# Patient Record
Sex: Female | Born: 1980 | Race: White | Hispanic: No | Marital: Single | Smoking: Never smoker
Health system: Southern US, Community
[De-identification: ages and names within clinical notes are randomized; demographics above are authoritative.]

## PROBLEM LIST (undated history)

## (undated) DIAGNOSIS — J45909 Unspecified asthma, uncomplicated: Secondary | ICD-10-CM

## (undated) DIAGNOSIS — J309 Allergic rhinitis, unspecified: Secondary | ICD-10-CM

## (undated) DIAGNOSIS — K219 Gastro-esophageal reflux disease without esophagitis: Secondary | ICD-10-CM

## (undated) DIAGNOSIS — R519 Headache, unspecified: Secondary | ICD-10-CM

## (undated) DIAGNOSIS — N926 Irregular menstruation, unspecified: Secondary | ICD-10-CM

## (undated) DIAGNOSIS — R51 Headache: Secondary | ICD-10-CM

## (undated) DIAGNOSIS — I10 Essential (primary) hypertension: Secondary | ICD-10-CM

## (undated) HISTORY — PX: OTHER SURGICAL HISTORY: SHX169

## (undated) HISTORY — DX: Essential (primary) hypertension: I10

## (undated) HISTORY — DX: Allergic rhinitis, unspecified: J30.9

## (undated) HISTORY — DX: Unspecified asthma, uncomplicated: J45.909

## (undated) HISTORY — PX: NASAL SINUS SURGERY: SHX719

---

## 2010-06-26 ENCOUNTER — Ambulatory Visit: Payer: Self-pay | Admitting: Internal Medicine

## 2012-03-12 ENCOUNTER — Ambulatory Visit: Payer: Self-pay | Admitting: Otolaryngology

## 2013-10-04 ENCOUNTER — Encounter: Payer: Self-pay | Admitting: Pulmonary Disease

## 2013-10-04 ENCOUNTER — Ambulatory Visit (INDEPENDENT_AMBULATORY_CARE_PROVIDER_SITE_OTHER): Payer: BC Managed Care – PPO | Admitting: Pulmonary Disease

## 2013-10-04 VITALS — BP 142/84 | HR 88 | Ht 63.0 in | Wt 143.0 lb

## 2013-10-04 DIAGNOSIS — J45909 Unspecified asthma, uncomplicated: Secondary | ICD-10-CM | POA: Insufficient documentation

## 2013-10-04 DIAGNOSIS — J309 Allergic rhinitis, unspecified: Secondary | ICD-10-CM

## 2013-10-04 DIAGNOSIS — K219 Gastro-esophageal reflux disease without esophagitis: Secondary | ICD-10-CM

## 2013-10-04 NOTE — Progress Notes (Signed)
Subjective:    Patient ID: Alexandria Sweeney, female    DOB: Mar 31, 1981, 32 y.o.   MRN: [id number]  HPI  This is a very pleasant 32 year old female who comes her clinic today for evaluation of asthma associated with allergies. She has had allergies over the years and recently was started on immunotherapy by [facility]. In the last year she has had some shortness of breath on exertion like when she is out for a walk. She had the sensation that she needed to take a deep breath to feel better. Sometimes cigarette smoke and stress will make her more short of breath and she feels that if she can step out of the situation her get away from the smoke and take a deep breath things get better. She was diagnosed with asthma by Dr. Meredeth Ide after apparently lung function tests showed "obstruction". She stated that her allergy symptoms have gotten better with immunotherapy. Her asthma has been treated with Pulmicort and albuterol. She says that the Pulmicort has really helped with the shortness of breath. She also takes Nasonex for postnasal drip as well and Singulair. Both of these are helping significantly. Acid reflux apparently contributes her shortness of breath. She typically props the head of her bed up. This week she said more nighttime symptoms because she recently changed her mattress and has failed to prop the new bed up. She never had significant respiratory symptoms as a child and never smoked cigarettes. She has minimal cough that she sometimes notes some wheezing. She denies chest tightness or pain. She uses her pro-air inhaler about 2-3 times a week.  Nothing has changed in her living environment recently. She lives in a house that has a basement but she does not believe there is any mold or mildew in the house.  Past Medical History  Diagnosis Date  . Hypertension   . Asthma   . Allergic rhinitis      Family History  Problem Relation Age of Onset  . Heart disease Mother    . Allergies Father   . Cancer Father   . Allergies Sister      History   Social History  . Marital Status: Single    Spouse Name: N/A    Number of Children: N/A  . Years of Education: N/A   Occupational History  . Not on file.   Social History Main Topics  . Smoking status: Never Smoker   . Smokeless tobacco: Never Used  . Alcohol Use: No  . Drug Use: No  . Sexual Activity: Not on file   Other Topics Concern  . Not on file   Social History Narrative  . No narrative on file     No Known Allergies   No outpatient prescriptions prior to visit.   No facility-administered medications prior to visit.       Review of Systems  Constitutional: Negative for fever, chills, diaphoresis, activity change, appetite change, fatigue and unexpected weight change.  HENT: Negative for congestion, dental problem, ear discharge, ear pain, facial swelling, hearing loss, mouth sores, nosebleeds, postnasal drip, rhinorrhea, sinus pressure, sneezing, sore throat, tinnitus, trouble swallowing and voice change.   Eyes: Negative for photophobia, discharge, itching and visual disturbance.  Respiratory: Positive for shortness of breath. Negative for apnea, cough, choking, chest tightness, wheezing and stridor.   Cardiovascular: Positive for palpitations. Negative for chest pain and leg swelling.  Gastrointestinal: Negative for nausea, vomiting, abdominal pain, constipation, blood in stool and abdominal distention.  Heartburn  Genitourinary: Negative for dysuria, urgency, frequency, hematuria, flank pain, decreased urine volume and difficulty urinating.  Musculoskeletal: Negative for arthralgias, back pain, gait problem, joint swelling, myalgias, neck pain and neck stiffness.  Skin: Negative for color change, pallor and rash.  Neurological: Negative for dizziness, tremors, seizures, syncope, speech difficulty, weakness, light-headedness, numbness and headaches.  Hematological: Negative for  adenopathy. Does not bruise/bleed easily.  Psychiatric/Behavioral: Negative for confusion, sleep disturbance and agitation. The patient is not nervous/anxious.        Objective:   Physical Exam  Filed Vitals:   10/04/13 1459 10/04/13 1500  BP:  142/84  Pulse:  88  Height: 5\' 3"  (1.6 m)   Weight: 143 lb (64.864 kg)   SpO2:  98%   blood pressure 142/84, heart rate 88, SpO2 98% on room air  Gen: well appearing, no acute distress HEENT: NCAT, PERRL, EOMi, OP clear, neck supple without masses PULM: CTA B CV: RRR, no mgr, no JVD AB: BS+, soft, nontender, no hsm Ext: warm, no edema, no clubbing, no cyanosis Derm: no rash or skin breakdown Neuro: A&Ox4, CN II-XII intact, strength 5/5 in all 4 extremities      Assessment & Plan:   Intrinsic asthma Alexandria Sweeney has clinical signs and symptoms which are consistent with her diagnosis of asthma. Fortunately, it seems to be very well controlled with Pulmicort. It does seem to be exacerbated by acid reflux. Today her simple spirometry is normal which is encouraging.  I also believe that there is very likely a strong component of vocal cord dysfunction along with asthma. We reviewed slow breathing techniques, open-mouth breathing and avoidance of stress and smoke today. Acid reflux is likely contributing to both this and her asthma.  In the future, we will need to make sure that she is having adequate control of her acid reflux. Her postnasal drip from allergic rhinitis also likely contributes and this will hopefully continue to get better with immunotherapy.  Plan: -Continue Pulmicort -Continue acid reflux lifestyle modification -Continue omeprazole -Continue as needed albuterol -Obtain records from Dr. Reita Cliche office  Allergic rhinitis Continue immunotherapy with [facility]  Acid reflux Lifestyle modification was reviewed, continue omeprazole    Updated Medication List Outpatient Encounter Prescriptions as of  10/04/2013  Medication Sig  . albuterol (PROAIR HFA) 108 (90 BASE) MCG/ACT inhaler Inhale 2 puffs into the lungs every 6 (six) hours as needed for wheezing.  . budesonide (PULMICORT) 180 MCG/ACT inhaler Inhale 1 puff into the lungs 2 (two) times daily.  Marland Kitchen diltiazem (CARDIZEM CD) 120 MG 24 hr capsule Take 360 mg by mouth daily.  . hydrochlorothiazide (HYDRODIURIL) 25 MG tablet Take 25 mg by mouth daily.  . mometasone (NASONEX) 50 MCG/ACT nasal spray Place 2 sprays into the nose daily.  . montelukast (SINGULAIR) 10 MG tablet Take 10 mg by mouth at bedtime.  . NON FORMULARY Allergy shots once weekly  . omeprazole (PRILOSEC) 20 MG capsule Take 20 mg by mouth 2 (two) times daily.  . progesterone (PROMETRIUM) 200 MG capsule Take 200 mg by mouth daily.

## 2013-10-04 NOTE — Assessment & Plan Note (Signed)
Continue immunotherapy with [facility]

## 2013-10-04 NOTE — Assessment & Plan Note (Addendum)
Alexandria Sweeney has clinical signs and symptoms which are consistent with her diagnosis of asthma. Fortunately, it seems to be very well controlled with Pulmicort. It does seem to be exacerbated by acid reflux. Today her simple spirometry is normal which is encouraging.  I also believe that there is very likely a strong component of vocal cord dysfunction along with asthma. We reviewed slow breathing techniques, open-mouth breathing and avoidance of stress and smoke today. Acid reflux is likely contributing to both this and her asthma.  In the future, we will need to make sure that she is having adequate control of her acid reflux. Her postnasal drip from allergic rhinitis also likely contributes and this will hopefully continue to get better with immunotherapy.  Plan: -Continue Pulmicort -Continue acid reflux lifestyle modification -Continue omeprazole -Continue as needed albuterol -Obtain records from Dr. Reita Cliche office

## 2013-10-04 NOTE — Patient Instructions (Signed)
We will request records from Dr. Lillette Boxer office Keep using your pulmicort and proAir as you are doing Keep your follow up with [facility]  Follow the lifestyle modification changes for acid reflux we gave you  Stay active and exercise regularly  We will see you back in 3-4 months or sooner if needed

## 2013-10-04 NOTE — Assessment & Plan Note (Signed)
Lifestyle modification was reviewed, continue omeprazole

## 2013-10-05 ENCOUNTER — Encounter: Payer: Self-pay | Admitting: Pulmonary Disease

## 2013-11-16 ENCOUNTER — Other Ambulatory Visit: Payer: Self-pay | Admitting: *Deleted

## 2013-11-16 ENCOUNTER — Telehealth: Payer: Self-pay | Admitting: Pulmonary Disease

## 2013-11-16 MED ORDER — MONTELUKAST SODIUM 10 MG PO TABS: 10.0000 mg | ORAL_TABLET | Freq: Every day | ORAL | Status: DC

## 2013-11-16 NOTE — Telephone Encounter (Signed)
Per Dr. Kendrick Fries ok to refill singular (generic) when needed.  Pt aware this [facility]. Nothing further needed

## 2013-11-17 ENCOUNTER — Telehealth: Payer: Self-pay | Admitting: Pulmonary Disease

## 2013-11-17 ENCOUNTER — Other Ambulatory Visit: Payer: Self-pay

## 2013-11-17 MED ORDER — MONTELUKAST SODIUM 10 MG PO TABS: 10.0000 mg | ORAL_TABLET | Freq: Every day | ORAL | Status: DC

## 2013-11-17 NOTE — Telephone Encounter (Signed)
Med has been reordered.  Pt is aware.  Nothing further needed. Caulfield,Ashley L, CMA

## 2014-01-24 ENCOUNTER — Ambulatory Visit: Payer: Self-pay | Admitting: Gastroenterology

## 2014-01-26 LAB — PATHOLOGY REPORT

## 2014-02-08 ENCOUNTER — Ambulatory Visit: Payer: Self-pay | Admitting: Gastroenterology

## 2014-03-09 DIAGNOSIS — R519 Headache, unspecified: Secondary | ICD-10-CM | POA: Insufficient documentation

## 2014-03-09 DIAGNOSIS — I10 Essential (primary) hypertension: Secondary | ICD-10-CM | POA: Insufficient documentation

## 2014-03-09 DIAGNOSIS — R51 Headache: Secondary | ICD-10-CM

## 2014-04-18 ENCOUNTER — Ambulatory Visit (INDEPENDENT_AMBULATORY_CARE_PROVIDER_SITE_OTHER): Payer: BC Managed Care – PPO | Admitting: Podiatry

## 2014-04-18 ENCOUNTER — Encounter: Payer: Self-pay | Admitting: Podiatry

## 2014-04-18 ENCOUNTER — Ambulatory Visit (INDEPENDENT_AMBULATORY_CARE_PROVIDER_SITE_OTHER): Payer: BC Managed Care – PPO

## 2014-04-18 VITALS — BP 131/81 | HR 83 | Resp 16 | Ht 62.0 in | Wt 138.0 lb

## 2014-04-18 DIAGNOSIS — R52 Pain, unspecified: Secondary | ICD-10-CM

## 2014-04-18 DIAGNOSIS — M7750 Other enthesopathy of unspecified foot: Secondary | ICD-10-CM

## 2014-04-18 DIAGNOSIS — M779 Enthesopathy, unspecified: Secondary | ICD-10-CM

## 2014-04-18 DIAGNOSIS — M778 Other enthesopathies, not elsewhere classified: Secondary | ICD-10-CM

## 2014-04-18 MED ORDER — METHYLPREDNISOLONE (PAK) 4 MG PO TABS: ORAL_TABLET | ORAL | Status: DC

## 2014-04-18 NOTE — Progress Notes (Signed)
   Subjective:    Patient ID: Alexandria Sweeney, female    DOB: [birth year], 33 y.o.   MRN: [id number]  HPI Comments: i have pain in my big toe on my left foot. This has been hurting since November 2014. i went to see dr cline and he gave me medicine and it didn't help. i dont know what it was it. Its still swollen. He took a x-ray and didn't see anything. i went to see dr Burnett Shenghedrick and he said he would send me over here. My toe has gotten worse. i have used ice and heat and epsom salt soaks.   Foot Pain      Review of Systems  HENT:       Sinus problems   Eyes: Positive for itching.  Gastrointestinal: Positive for constipation.       Objective:   Physical Exam: I have reviewed her past medical history medications allergies surgeries social history and review of systems. Pulses are strongly palpable bilateral. Neurologic sensorium is intact per since once the monofilament. Deep tendon reflexes are intact bilateral. Muscle strength is 5 over 5 dorsiflexors plantar flexors inverters everters all intrinsic musculature is intact. Orthopedic evaluation demonstrates pain on in range of motion and on palpation of the first metatarsophalangeal joint of the left foot. Radiographic evaluation does demonstrate early joint space narrowing lateral spurring at the metatarsophalangeal joint and what appears to be an early cyst subchondral of the left first metatarsal.        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  Plan: Intra-articular injection performed today. Prescription for Medrol Dosepak. And I will followup with her in one month.

## 2014-04-19 LAB — SEDIMENTATION RATE: Sed Rate: 2 mm/hr (ref 0–32)

## 2014-04-19 LAB — URIC ACID: Uric Acid: 6.3 mg/dL (ref 2.5–7.1)

## 2014-04-19 LAB — RHEUMATOID FACTOR

## 2014-04-19 LAB — ANA: Anti Nuclear Antibody(ANA): POSITIVE — AB

## 2014-04-19 LAB — C-REACTIVE PROTEIN: CRP: 0.9 mg/L (ref 0.0–4.9)

## 2014-04-28 NOTE — Progress Notes (Signed)
PT CALLED AND SET HERSELF UP APPT WITH DR Haydee Salter [phone]. FAX [phone] APPT SCH 6.11.15 AT 9:00. ALL CHART NOTES FAXED OVER.

## 2014-05-08 ENCOUNTER — Emergency Department: Payer: Self-pay | Admitting: Emergency Medicine

## 2014-05-09 ENCOUNTER — Telehealth: Payer: Self-pay | Admitting: Pulmonary Disease

## 2014-05-09 MED ORDER — ALBUTEROL SULFATE HFA 108 (90 BASE) MCG/ACT IN AERS: 2.0000 | INHALATION_SPRAY | Freq: Four times a day (QID) | RESPIRATORY_TRACT | Status: DC | PRN

## 2014-05-09 MED ORDER — BUDESONIDE 180 MCG/ACT IN AEPB: 1.0000 | INHALATION_SPRAY | Freq: Two times a day (BID) | RESPIRATORY_TRACT | Status: DC

## 2014-05-09 NOTE — Telephone Encounter (Signed)
See OV 10/2013; We will request records from Dr. Lillette Boxer office Keep using your pulmicort and proAir as you are doing Keep your follow up with [facility] Follow the lifestyle modification changes for acid reflux we gave you Stay active and exercise regularly We will see you back in 3-4 months or sooner if needed ---  Pt is scheduled to come in July. Pt aware refills have been sent. Nothing further needed

## 2014-05-16 ENCOUNTER — Ambulatory Visit: Payer: BC Managed Care – PPO | Admitting: Podiatry

## 2014-05-18 DIAGNOSIS — M79671 Pain in right foot: Secondary | ICD-10-CM | POA: Insufficient documentation

## 2014-05-18 DIAGNOSIS — M359 Systemic involvement of connective tissue, unspecified: Secondary | ICD-10-CM | POA: Insufficient documentation

## 2014-05-18 DIAGNOSIS — M25522 Pain in left elbow: Secondary | ICD-10-CM | POA: Insufficient documentation

## 2014-05-19 ENCOUNTER — Encounter (HOSPITAL_COMMUNITY): Payer: Self-pay | Admitting: Emergency Medicine

## 2014-05-19 ENCOUNTER — Emergency Department (HOSPITAL_COMMUNITY)
Admission: EM | Admit: 2014-05-19 | Discharge: 2014-05-19 | Disposition: A | Discharge status: Home / Self Care | Payer: BC Managed Care – PPO | Attending: Emergency Medicine | Admitting: Emergency Medicine

## 2014-05-19 DIAGNOSIS — Z79899 Other long term (current) drug therapy: Secondary | ICD-10-CM | POA: Insufficient documentation

## 2014-05-19 DIAGNOSIS — L02619 Cutaneous abscess of unspecified foot: Secondary | ICD-10-CM | POA: Insufficient documentation

## 2014-05-19 DIAGNOSIS — I10 Essential (primary) hypertension: Secondary | ICD-10-CM | POA: Insufficient documentation

## 2014-05-19 DIAGNOSIS — L03039 Cellulitis of unspecified toe: Principal | ICD-10-CM | POA: Insufficient documentation

## 2014-05-19 DIAGNOSIS — Z791 Long term (current) use of non-steroidal anti-inflammatories (NSAID): Secondary | ICD-10-CM | POA: Insufficient documentation

## 2014-05-19 DIAGNOSIS — IMO0002 Reserved for concepts with insufficient information to code with codable children: Secondary | ICD-10-CM | POA: Insufficient documentation

## 2014-05-19 DIAGNOSIS — Z792 Long term (current) use of antibiotics: Secondary | ICD-10-CM | POA: Insufficient documentation

## 2014-05-19 DIAGNOSIS — J45909 Unspecified asthma, uncomplicated: Secondary | ICD-10-CM | POA: Insufficient documentation

## 2014-05-19 DIAGNOSIS — L03115 Cellulitis of right lower limb: Secondary | ICD-10-CM

## 2014-05-19 MED ORDER — DOXYCYCLINE HYCLATE 100 MG PO CAPS: 100.0000 mg | ORAL_CAPSULE | Freq: Two times a day (BID) | ORAL | Status: DC

## 2014-05-19 MED ORDER — DOXYCYCLINE HYCLATE 100 MG PO TABS
100.0000 mg | ORAL_TABLET | Freq: Once | ORAL | Status: AC
  Administered 2014-05-19: 100 mg via ORAL
  Filled 2014-05-19: qty 1

## 2014-05-19 NOTE — ED Notes (Signed)
+  2 DP bilaterally. BLE Cap refill brisk/warm.

## 2014-05-19 NOTE — ED Notes (Signed)
Pt reports 2 weeks ago had blister on r foot between little toe and 4 th toe.  Reports blister popped and had red streak on foot and radiating up to knee.  Was given an antibiotic at an urgent care approx 1 week ago.  Reports was given clindamycin and tramadol.   Last Tuesday red streak started to fade but then came back Thursday morning.  Pt went back to Urgent care and was given an injection of rocephin 1g.   Pt went to PCP then next day and was given another shot of rocephin.   Pt says still is no better.

## 2014-05-19 NOTE — Discharge Instructions (Signed)
Date buttock as directed. Return for any newer worse symptoms.   Plan to followup with your regular Dr. in the next 3-5 days for recheck.

## 2014-05-19 NOTE — ED Provider Notes (Signed)
CSN: [id number]     Arrival date & time 05/19/14  1722 History   First MD Initiated Contact with Patient 05/19/14 1732     Chief Complaint  Patient presents with  . Foot Pain     (Consider location/radiation/quality/duration/timing/severity/associated sxs/prior Treatment) Patient is a 33 y.o. female presenting with lower extremity pain. The history is provided by the patient.  Foot Pain Pertinent negatives include no chest pain, no abdominal pain, no headaches and no shortness of breath.   patient with a two-week history of cellulitis to the right foot. Started with a blister between the fourth and fifth toe. Patient originally treated with clindamycin without complete resolution then received Rocephin IM x2. Things did resolve. Patient noted recurrence of some of the redness to the forefoot and some streaking today. Originally it did get better but now it's back. No significant pain associated with it. No fevers.  Past Medical History  Diagnosis Date  . Hypertension   . Asthma   . Allergic rhinitis    Past Surgical History  Procedure Laterality Date  . Nasal sinus surgery    . Thumb surgery     Family History  Problem Relation Age of Onset  . Heart disease Mother   . Allergies Father   . Cancer Father   . Allergies Sister    History  Substance Use Topics  . Smoking status: Never Smoker   . Smokeless tobacco: Never Used  . Alcohol Use: No   OB History   Grav Para Term Preterm Abortions TAB SAB Ect Mult Living                 Review of Systems  Constitutional: Negative for fever.  HENT: Positive for congestion.   Eyes: Negative for visual disturbance.  Respiratory: Negative for shortness of breath.   Cardiovascular: Negative for chest pain.  Gastrointestinal: Negative for nausea, vomiting and abdominal pain.  Musculoskeletal: Negative for back pain.  Skin: Positive for wound.  Neurological: Negative for headaches.  Hematological: Does not bruise/bleed easily.   Psychiatric/Behavioral: Negative for confusion.      Allergies  Nadolol  Home Medications   Prior to Admission medications   Medication Sig Start Date End Date Taking? Authorizing Provider  albuterol (PROAIR HFA) 108 (90 BASE) MCG/ACT inhaler Inhale 2 puffs into the lungs every 6 (six) hours as needed for wheezing. 05/09/14  Yes Lupita Leashouglas B McQuaid, MD  B-Complex TABS Place 1 tablet under the tongue daily.   Yes Historical Provider, MD  budesonide (PULMICORT) 180 MCG/ACT inhaler Inhale 1 puff into the lungs 2 (two) times daily. 05/09/14  Yes Lupita Leashouglas B McQuaid, MD  calcium carbonate (OS-CAL) 600 MG TABS tablet Take 600 mg by mouth daily.   Yes Historical Provider, MD  cetirizine (ZYRTEC) 10 MG tablet Take 10 mg by mouth daily.   Yes Historical Provider, MD  clindamycin (CLEOCIN) 300 MG capsule Take 300 mg by mouth every 6 (six) hours.   Yes Historical Provider, MD  diltiazem (CARDIZEM CD) 120 MG 24 hr capsule Take 120 mg by mouth daily.    Yes Historical Provider, MD  fluticasone (FLONASE) 50 MCG/ACT nasal spray Place into both nostrils daily.   Yes Historical Provider, MD  hydrochlorothiazide (HYDRODIURIL) 25 MG tablet Take 25 mg by mouth daily.   Yes Historical Provider, MD  meloxicam (MOBIC) 7.5 MG tablet Take 7.5 mg by mouth 2 (two) times daily.   Yes Historical Provider, MD  montelukast (SINGULAIR) 10 MG tablet Take 1 tablet (10  mg total) by mouth at bedtime. 11/17/13  Yes Lupita Leashouglas B McQuaid, MD  pantoprazole (PROTONIX) 40 MG tablet Take 40 mg by mouth daily.   Yes Historical Provider, MD  traMADol (ULTRAM) 50 MG tablet Take 50 mg by mouth every 6 (six) hours as needed for moderate pain.   Yes Historical Provider, MD  doxycycline (VIBRAMYCIN) 100 MG capsule Take 1 capsule (100 mg total) by mouth 2 (two) times daily. 05/19/14   Vanetta MuldersScott Zackowski, MD   BP 143/94  Pulse 86  Temp(Src) 98.5 F (36.9 C) (Oral)  Resp 20  Ht 5\' 2"  (1.575 m)  Wt 145 lb (65.772 kg)  BMI 26.51 kg/m2  SpO2 100%   LMP 04/30/2014 Physical Exam  Nursing note and vitals reviewed. Constitutional: She is oriented to person, place, and time. She appears well-developed and well-nourished. No distress.  HENT:  Head: Normocephalic and atraumatic.  Mouth/Throat: Oropharynx is clear and moist.  Eyes: Conjunctivae and EOM are normal. Pupils are equal, round, and reactive to light.  Neck: Normal range of motion.  Cardiovascular: Normal rate, regular rhythm and normal heart sounds.   No murmur heard. Pulmonary/Chest: Effort normal and breath sounds normal. No respiratory distress.  Abdominal: Soft. Bowel sounds are normal. There is no tenderness.  Musculoskeletal: She exhibits no edema and no tenderness.  Right foot with heel blister between the fourth and fifth toe. The erythema to the anterior forefoot was a line of streaking to the ankle. Nontender to palpation. Dorsalis pedis pulse is 2+. Good range of motion.  Neurological: She is alert and oriented to person, place, and time. No cranial nerve deficit. She exhibits normal muscle tone. Coordination normal.  Skin: Skin is warm. There is erythema.    ED Course  Procedures (including critical care time) Labs Review Labs Reviewed - No data to display  Imaging Review No results found.   EKG Interpretation None      MDM   Final diagnoses:  Cellulitis of right lower extremity    Patient with recurrent cellulitis to her right foot. Patient with a blister between her fourth and fifth toe not secondarily infected started 2 weeks ago. Patient on clindamycin also receiving some IM Rocephin last week things did resolve. Then today started to come back again. Some redness to the forefoot with some streaking up to the level of the ankle. Patient has not been treated with doxycycline.    Vanetta MuldersScott Zackowski, MD 05/19/14 1819

## 2014-05-31 ENCOUNTER — Other Ambulatory Visit (HOSPITAL_COMMUNITY): Payer: Self-pay | Admitting: Rheumatology

## 2014-05-31 DIAGNOSIS — M1990 Unspecified osteoarthritis, unspecified site: Secondary | ICD-10-CM

## 2014-06-08 ENCOUNTER — Encounter (HOSPITAL_COMMUNITY): Payer: BC Managed Care – PPO

## 2014-06-10 ENCOUNTER — Encounter (HOSPITAL_COMMUNITY)
Admission: RE | Admit: 2014-06-10 | Discharge: 2014-06-10 | Disposition: A | Discharge status: Home / Self Care | Payer: BC Managed Care – PPO | Source: Ambulatory Visit | Attending: Rheumatology | Admitting: Rheumatology

## 2014-06-10 DIAGNOSIS — M064 Inflammatory polyarthropathy: Secondary | ICD-10-CM | POA: Insufficient documentation

## 2014-06-10 DIAGNOSIS — M1990 Unspecified osteoarthritis, unspecified site: Secondary | ICD-10-CM

## 2014-06-10 MED ORDER — TECHNETIUM TC 99M MEDRONATE IV KIT
25.0000 | PACK | Freq: Once | INTRAVENOUS | Status: AC | PRN
  Administered 2014-06-10: 25 via INTRAVENOUS

## 2014-06-14 ENCOUNTER — Ambulatory Visit: Payer: BC Managed Care – PPO | Admitting: Pulmonary Disease

## 2014-06-15 ENCOUNTER — Encounter: Payer: Self-pay | Admitting: Pulmonary Disease

## 2014-07-11 ENCOUNTER — Telehealth: Payer: Self-pay | Admitting: Pulmonary Disease

## 2014-07-11 MED ORDER — ALBUTEROL SULFATE HFA 108 (90 BASE) MCG/ACT IN AERS: 2.0000 | INHALATION_SPRAY | Freq: Four times a day (QID) | RESPIRATORY_TRACT | Status: DC | PRN

## 2014-07-11 MED ORDER — MONTELUKAST SODIUM 10 MG PO TABS: 10.0000 mg | ORAL_TABLET | Freq: Every day | ORAL | Status: DC

## 2014-07-11 MED ORDER — BUDESONIDE 180 MCG/ACT IN AEPB: 1.0000 | INHALATION_SPRAY | Freq: Two times a day (BID) | RESPIRATORY_TRACT | Status: DC

## 2014-07-11 NOTE — Telephone Encounter (Signed)
Wrong chart

## 2014-07-11 NOTE — Telephone Encounter (Signed)
Spoke with pt. Aware RX's have been sent in. Nothing further needed 

## 2014-08-11 ENCOUNTER — Other Ambulatory Visit: Payer: Self-pay

## 2014-08-11 MED ORDER — MONTELUKAST SODIUM 10 MG PO TABS: 10.0000 mg | ORAL_TABLET | Freq: Every day | ORAL | Status: DC

## 2014-08-16 ENCOUNTER — Ambulatory Visit (INDEPENDENT_AMBULATORY_CARE_PROVIDER_SITE_OTHER): Payer: BC Managed Care – PPO | Admitting: Pulmonary Disease

## 2014-08-16 ENCOUNTER — Encounter: Payer: Self-pay | Admitting: Pulmonary Disease

## 2014-08-16 VITALS — BP 114/70 | HR 80 | Ht 63.0 in | Wt 150.0 lb

## 2014-08-16 DIAGNOSIS — M1990 Unspecified osteoarthritis, unspecified site: Secondary | ICD-10-CM | POA: Insufficient documentation

## 2014-08-16 DIAGNOSIS — J4520 Mild intermittent asthma, uncomplicated: Secondary | ICD-10-CM

## 2014-08-16 DIAGNOSIS — M129 Arthropathy, unspecified: Secondary | ICD-10-CM

## 2014-08-16 DIAGNOSIS — J45909 Unspecified asthma, uncomplicated: Secondary | ICD-10-CM

## 2014-08-16 DIAGNOSIS — Z23 Encounter for immunization: Secondary | ICD-10-CM

## 2014-08-16 NOTE — Patient Instructions (Signed)
Keep taking your medicine as you are doing We will see you back in 6 months or sooner if needed

## 2014-08-16 NOTE — Progress Notes (Signed)
Subjective:    Patient ID: Alexandria Sweeney, female    DOB: Feb 18, 1981, 33 y.o.   MRN: [id number]  Synopsis: Alexandria Sweeney has mild persistent asthma well controlled with Pulmicort. March 2014 Spirometry > ratio not documented, not reproducible by ATS criteria November 2014 simple spirometry ratio 84%, FEV1 2.90 L (93% predicted) 10/2013 ACQ 14/7  HPI  9/15/115 ROV > Alexandria Sweeney has been doing OK. She says that her breathing has been OK.  She has recently been diagnosed with a form of arthritis and has been started on plaquenil.  She is concerned about the side effects of taking this with her asthma medications.  She is not aware of the official diagnosis and has not been told if she had lupus, RA or osteoarthritis.  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  She has had neck pin, elbow and shoulder pain.  She apparently had an elevated ANA screen, and then had a "positive bone scan".  She was started on plaquenil and diclofenac.  She was told to come back.    She says her breathing has been great.  She has only had to use albuterol twice in the last moth.  She has been trying to stay inside to avoid ragweed.    Past Medical History  Diagnosis Date  . Hypertension   . Asthma   . Allergic rhinitis      Review of Systems     Objective:   Physical Exam Filed Vitals:   08/16/14 1109  BP: 114/70  Pulse: 80  Height:  (1.6 m)  Weight: 150 lb (68.04 kg)  SpO2: 98%   RA Gen: well appearing, no acute distress HEENT: NCAT, EOMi, OP clear, PULM: CTA B CV: RRR, no mgr, no JVD AB: BS+, soft, nontender,  Ext: warm, no edema, no clubbing, no cyanosis Derm: no rash or skin breakdown Neuro: A&Ox4, CN II-XII intact, MAEW        Assessment & Plan:   Intrinsic asthma This has been a stable interval for [facility]. She should continue on her current therapy. We will give her a flu shot.  Arthritis It sounds like she has been diagnosed with some sort  of inflammatory arthritis. She will continue followup with her rheumatologist.    Updated Medication List Outpatient Encounter Prescriptions as of 08/16/2014  Medication Sig  . albuterol (PROAIR HFA) 108 (90 BASE) MCG/ACT inhaler Inhale 2 puffs into the lungs every 6 (six) hours as needed for wheezing.  Marland Kitchen B-Complex TABS Place 1 tablet under the tongue daily.  . budesonide (PULMICORT) 180 MCG/ACT inhaler Inhale 1 puff into the lungs 2 (two) times daily.  . calcium carbonate (OS-CAL) 600 MG TABS tablet Take 600 mg by mouth daily.  . cetirizine (ZYRTEC) 10 MG tablet Take 10 mg by mouth daily.  . diclofenac (VOLTAREN) 75 MG EC tablet Take 75 mg by mouth 2 (two) times daily.  Marland Kitchen diltiazem (CARDIZEM CD) 120 MG 24 hr capsule Take 120 mg by mouth daily.   Marland Kitchen docusate sodium (COLACE) 100 MG capsule Take 100 mg by mouth 2 (two) times daily.  . fluticasone (FLONASE) 50 MCG/ACT nasal spray Place into both nostrils daily.  . Garlic 1000 MG CAPS Take 1 capsule by mouth daily.  . Ginger, Zingiber officinalis, (GINGER ROOT) 550 MG CAPS Take 1 capsule by mouth 2 (two) times daily.  . hydrochlorothiazide (HYDRODIURIL) 25 MG tablet Take 25 mg by mouth daily.  . montelukast (SINGULAIR) 10  MG tablet Take 1 tablet (10 mg total) by mouth at bedtime.  . Omega-3 Fatty Acids (FISH OIL) 1360 MG CAPS Take 1 capsule by mouth daily.  . pantoprazole (PROTONIX) 40 MG tablet Take 40 mg by mouth daily.  Bertram Gala Glycol-Propyl Glycol (SYSTANE) 0.4-0.3 % GEL Apply to eye. 1 drop each eye bid  . Progesterone 100 MG CAPS Take 500 mg by mouth daily.  . Turmeric 500 MG CAPS Take 1 capsule by mouth 2 (two) times daily.  . clindamycin (CLEOCIN) 300 MG capsule Take 300 mg by mouth every 6 (six) hours.  Marland Kitchen doxycycline (VIBRAMYCIN) 100 MG capsule Take 1 capsule (100 mg total) by mouth 2 (two) times daily.  . meloxicam (MOBIC) 7.5 MG tablet Take 7.5 mg by mouth 2 (two) times daily.  . traMADol (ULTRAM) 50 MG tablet Take 50 mg by mouth  every 6 (six) hours as needed for moderate pain.

## 2014-08-16 NOTE — Assessment & Plan Note (Signed)
It sounds like she has been diagnosed with some sort of inflammatory arthritis. She will continue followup with her rheumatologist.

## 2014-08-16 NOTE — Assessment & Plan Note (Signed)
This has been a stable interval for [facility]. She should continue on her current therapy. We will give her a flu shot.

## 2014-10-10 ENCOUNTER — Telehealth: Payer: Self-pay | Admitting: Pulmonary Disease

## 2014-10-10 MED ORDER — MONTELUKAST SODIUM 10 MG PO TABS: 10.0000 mg | ORAL_TABLET | Freq: Every day | ORAL | Status: DC

## 2014-10-10 NOTE — Telephone Encounter (Signed)
Last ov 9.15.15 w/ BQ Last refill on Singulair 9.10.15 #90 w/ 0 refills Pt stated she received the last refill and reports that it is time to place her next order - requesting a years' worth of refills This has been done, pt is aware Nothing further needed; will sign off

## 2014-10-21 ENCOUNTER — Other Ambulatory Visit: Payer: Self-pay

## 2014-10-21 MED ORDER — ALBUTEROL SULFATE HFA 108 (90 BASE) MCG/ACT IN AERS: 2.0000 | INHALATION_SPRAY | Freq: Four times a day (QID) | RESPIRATORY_TRACT | Status: DC | PRN

## 2015-01-05 ENCOUNTER — Telehealth: Payer: Self-pay | Admitting: Podiatry

## 2015-01-05 NOTE — Telephone Encounter (Signed)
Patient called and left a message requesting a copy of her X-rays to a CD.

## 2015-01-18 ENCOUNTER — Telehealth: Payer: Self-pay | Admitting: Podiatry

## 2015-01-18 NOTE — Telephone Encounter (Signed)
PT CALLED AND ASKED IF HER DISC OF HER XRAYS WERE SENT TO DR DEVENSHIRE.SHE DROPPED OFF CONSENT AT THE BEGINNING OF FEB

## 2015-04-13 ENCOUNTER — Ambulatory Visit: Payer: Self-pay | Admitting: Pulmonary Disease

## 2015-07-19 ENCOUNTER — Encounter: Payer: Self-pay | Admitting: Pulmonary Disease

## 2015-07-19 ENCOUNTER — Ambulatory Visit (INDEPENDENT_AMBULATORY_CARE_PROVIDER_SITE_OTHER): Payer: BLUE CROSS/BLUE SHIELD | Admitting: Pulmonary Disease

## 2015-07-19 VITALS — BP 138/84 | HR 73 | Ht 62.0 in | Wt 149.0 lb

## 2015-07-19 DIAGNOSIS — J4520 Mild intermittent asthma, uncomplicated: Secondary | ICD-10-CM

## 2015-07-19 NOTE — Patient Instructions (Signed)
Continue current medications as previously prescribed Keep up the good work  Dr Billy Fischer

## 2015-07-19 NOTE — Progress Notes (Signed)
Subjective:     Alexandria Sweeney is an 34 y.o. female who presents for follow up of asthma.  Alexandria Sweeney has very infrequent symptoms usually although she describes increased frequency over the past week which she attributes to increased pollens.. She has no nighttime asthma symptoms. The patient is using short-acting beta agonists for symptom control less than or equal to 2 days per week. She has exacerbations requiring oral systemic corticosteroids 0 times per year. Current limitations in activity from asthma: none. Number of days of school or work missed in the last month: 0. Number of urgent/emergent visit in last year: 0   She has previously tested positive to several allergens including pollens, grasses, cockroach feces, dust mites. Several yrs ago she underwent allergy shots for 1 1/2 yrs without any benefit noted  She is presently maintained on inhaled budesonide, montelukast, cetirizine and PRN albuterol.   Review of Systems Pertinent items are noted in HPI.    Objective:    Oxygen saturation 95% on room air BP 138/84 mmHg  Pulse 73  Ht 5\' 2"  (1.575 m)  Wt 149 lb (67.586 kg)  BMI 27.25 kg/m2  SpO2 95%  General Appearance:    Alert, cooperative, no distress, appears stated age  Head:    Normocephalic, without obvious abnormality, atraumatic  Eyes:    PERRL, conjunctiva/corneas clear, EOM's intact, fundi    benign, both eyes  Ears:    Normal TM's and external ear canals, both ears     Throat:   Lips, mucosa, and tongue normal; teeth and gums normal  Neck:   Supple, symmetrical, trachea midline, no adenopathy;    thyroid:  no enlargement/tenderness/nodules; no carotid   bruit or JVD  Back:     Symmetric, no curvature, ROM normal, no CVA tenderness  Lungs:     Clear to auscultation bilaterally, respirations unlabored      Heart:    Regular rate and rhythm, S1 and S2 normal, no murmur, rub   or gallop     Abdomen:     Soft, non-tender, bowel sounds present         Extremities:   Extremities normal, no cyanosis or edema     Skin:   Skin color, texture, turgor normal, no rashes or lesions            Assessment:    Mild persistent asthma, Well controlled.     Plan:    Review treatment goals of symptom prevention.   Continue current medical regimen Routine follow up in 6 months or sooner as needed  She has refills available on all of her medication  Billy Fischer, MD  Subjective:     Alexandria Sweeney is an 34 y.o. female who presents for follow up of asthma.  Alexandria Sweeney has very infrequent symptoms usually although she describes increased frequency over the past week which she attributes to increased pollens.. She has no nighttime asthma symptoms. The patient is using short-acting beta agonists for symptom control less than or equal to 2 days per week. She has exacerbations requiring oral systemic corticosteroids 0 times per year. Current limitations in activity from asthma: none. Number of days of school or work missed in the last month: 0. Number of urgent/emergent visit in last year: 0   She has previously tested positive to several allergens including pollens, grasses, cockroach feces, dust mites. Several yrs ago she underwent allergy shots for 1 1/2 yrs without any benefit noted  She is presently maintained on inhaled  budesonide, montelukast, cetirizine and PRN albuterol.   Review of Systems Pertinent items are noted in HPI.    Objective:    Oxygen saturation 95% on room air BP 138/84 mmHg  Pulse 73  Ht  (1.575 m)  Wt 149 lb (67.586 kg)  BMI 27.25 kg/m2  SpO2 95%  General Appearance:    Alert, cooperative, no distress, appears stated age  Head:    Normocephalic, without obvious abnormality, atraumatic  Eyes:    PERRL, conjunctiva/corneas clear, EOM's intact, fundi    benign, both eyes  Ears:    Normal TM's and external ear canals, both ears     Throat:   Lips, mucosa, and tongue normal; teeth and gums normal  Neck:    Supple, symmetrical, trachea midline, no adenopathy;    thyroid:  no enlargement/tenderness/nodules; no carotid   bruit or JVD  Back:     Symmetric, no curvature, ROM normal, no CVA tenderness  Lungs:     Clear to auscultation bilaterally, respirations unlabored      Heart:    Regular rate and rhythm, S1 and S2 normal, no murmur, rub   or gallop     Abdomen:     Soft, non-tender, bowel sounds present        Extremities:   Extremities normal, no cyanosis or edema     Skin:   Skin color, texture, turgor normal, no rashes or lesions            Assessment:    Mild persistent asthma, Well controlled.     Plan:    Review treatment goals of symptom prevention.   Continue current medical regimen Routine follow up in 6 months or sooner as needed  She has refills available on all of her medication  Billy Fischer, MD

## 2015-07-21 ENCOUNTER — Encounter: Payer: Self-pay | Admitting: *Deleted

## 2015-07-24 ENCOUNTER — Ambulatory Visit: Payer: BLUE CROSS/BLUE SHIELD | Admitting: Anesthesiology

## 2015-07-24 ENCOUNTER — Ambulatory Visit
Admission: RE | Admit: 2015-07-24 | Discharge: 2015-07-24 | Disposition: A | Discharge status: Home / Self Care | Payer: BLUE CROSS/BLUE SHIELD | Source: Ambulatory Visit | Attending: Gastroenterology | Admitting: Gastroenterology

## 2015-07-24 ENCOUNTER — Encounter
Admission: RE | Disposition: A | Discharge status: Home / Self Care | Payer: Self-pay | Source: Ambulatory Visit | Attending: Gastroenterology

## 2015-07-24 DIAGNOSIS — K317 Polyp of stomach and duodenum: Secondary | ICD-10-CM | POA: Insufficient documentation

## 2015-07-24 DIAGNOSIS — R1013 Epigastric pain: Secondary | ICD-10-CM | POA: Insufficient documentation

## 2015-07-24 DIAGNOSIS — J45909 Unspecified asthma, uncomplicated: Secondary | ICD-10-CM | POA: Diagnosis not present

## 2015-07-24 DIAGNOSIS — Z7951 Long term (current) use of inhaled steroids: Secondary | ICD-10-CM | POA: Diagnosis not present

## 2015-07-24 DIAGNOSIS — K219 Gastro-esophageal reflux disease without esophagitis: Secondary | ICD-10-CM | POA: Diagnosis not present

## 2015-07-24 DIAGNOSIS — I10 Essential (primary) hypertension: Secondary | ICD-10-CM | POA: Diagnosis not present

## 2015-07-24 DIAGNOSIS — Z79899 Other long term (current) drug therapy: Secondary | ICD-10-CM | POA: Insufficient documentation

## 2015-07-24 DIAGNOSIS — R140 Abdominal distension (gaseous): Secondary | ICD-10-CM | POA: Diagnosis not present

## 2015-07-24 DIAGNOSIS — R1012 Left upper quadrant pain: Secondary | ICD-10-CM | POA: Diagnosis not present

## 2015-07-24 HISTORY — DX: Irregular menstruation, unspecified: N92.6

## 2015-07-24 HISTORY — PX: ESOPHAGOGASTRODUODENOSCOPY (EGD) WITH PROPOFOL: SHX5813

## 2015-07-24 HISTORY — DX: Gastro-esophageal reflux disease without esophagitis: K21.9

## 2015-07-24 HISTORY — DX: Headache, unspecified: R51.9

## 2015-07-24 HISTORY — DX: Headache: R51

## 2015-07-24 LAB — POCT PREGNANCY, URINE
Preg Test, Ur: NEGATIVE
Preg Test, Ur: NEGATIVE

## 2015-07-24 SURGERY — ESOPHAGOGASTRODUODENOSCOPY (EGD) WITH PROPOFOL
Anesthesia: General

## 2015-07-24 MED ORDER — MIDAZOLAM HCL 2 MG/2ML IJ SOLN
INTRAMUSCULAR | Status: DC | PRN
  Administered 2015-07-24: 1 mg via INTRAVENOUS

## 2015-07-24 MED ORDER — SODIUM CHLORIDE 0.9 % IV SOLN: INTRAVENOUS | Status: DC

## 2015-07-24 MED ORDER — SODIUM CHLORIDE 0.9 % IV SOLN
INTRAVENOUS | Status: DC
  Administered 2015-07-24: 1000 mL via INTRAVENOUS

## 2015-07-24 MED ORDER — PROPOFOL 10 MG/ML IV BOLUS
INTRAVENOUS | Status: DC | PRN
  Administered 2015-07-24: 20 mg via INTRAVENOUS
  Administered 2015-07-24: 40 mg via INTRAVENOUS
  Administered 2015-07-24 (×4): 20 mg via INTRAVENOUS

## 2015-07-24 NOTE — Transfer of Care (Signed)
Immediate Anesthesia Transfer of Care Note  Patient: Alexandria Sweeney  Procedure(s) Performed: Procedure(s): ESOPHAGOGASTRODUODENOSCOPY (EGD) WITH PROPOFOL (N/A)  Patient Location: PACU and Endoscopy Unit  Anesthesia Type:General  Level of Consciousness: alert   Airway & Oxygen Therapy: Patient Spontanous Breathing and Patient connected to nasal cannula oxygen  Post-op Assessment: Report given to RN  Post vital signs: stable  Last Vitals:  Filed Vitals:   07/24/15 0851  BP: 131/92  Pulse: 69  Temp: 36.2 C  Resp: 16    Complications: No apparent anesthesia complications

## 2015-07-24 NOTE — Anesthesia Postprocedure Evaluation (Signed)
  Anesthesia Post-op Note  Patient: Alexandria Sweeney  Procedure(s) Performed: Procedure(s): ESOPHAGOGASTRODUODENOSCOPY (EGD) WITH PROPOFOL (N/A)  Anesthesia type:General  Patient location: PACU  Post pain: Pain level controlled  Post assessment: Post-op Vital signs reviewed, Patient's Cardiovascular Status Stable, Respiratory Function Stable, Patent Airway and No signs of Nausea or vomiting  Post vital signs: Reviewed and stable  Last Vitals:  Filed Vitals:   07/24/15 0851  BP: 131/92  Pulse: 69  Temp: 36.2 C  Resp: 16    Level of consciousness: awake, alert  and patient cooperative  Complications: No apparent anesthesia complications

## 2015-07-24 NOTE — Discharge Instructions (Signed)

## 2015-07-24 NOTE — Op Note (Signed)
[facility] Gastroenterology Patient Name: Alexandria Sweeney Procedure Date: 07/24/2015 9:57 AM MRN: [id number] Account #: [id number] Date of Birth: 1981/11/03 Admit Type: Outpatient Age: 34 Room: [facility] ENDO ROOM 3 Gender: Female Note Status: Finalized Procedure:         Upper GI endoscopy Indications:       Surveillance procedure( f/u hyperplastic gastric polyp),                     Epigastric abdominal pain, Abdominal pain in the left                     upper quadrant, Abdominal bloating Patient Profile:   This is a 34 year old female. Providers:         Rhona Raider. Shelle Iron, MD Referring MD:      Rhona Leavens. Burnett Sheng, MD (Referring MD) Medicines:         Propofol per Anesthesia Complications:     No immediate complications. Procedure:         Pre-Anesthesia Assessment:                    - Prior to the procedure, a History and Physical was                     performed, and patient medications, allergies and                     sensitivities were reviewed. The patient's tolerance of                     previous anesthesia was reviewed.                    After obtaining informed consent, the endoscope was passed                     under direct vision. Throughout the procedure, the                     patient's blood pressure, pulse, and oxygen saturations                     were monitored continuously. The Endoscope was introduced                     through the mouth, and advanced to the second part of                     duodenum. The upper GI endoscopy was accomplished without                     difficulty. The patient tolerated the procedure well. Findings:      The esophagus was normal.      Two 2 to 3 mm sessile polyps were found in the cardia. These polyps were       removed with a cold biopsy forceps. Resection and retrieval were       complete. Estimated blood loss: none.      The examined duodenum was normal.      The exam was otherwise without  abnormality. Impression:        - Normal esophagus.                    - Two gastric polyps. Resected and retrieved.                    -  Normal examined duodenum.                    - The examination was otherwise normal. Recommendation:    - Observe patient in GI recovery unit.                    - Resume regular diet.                    - Continue present medications.                    - Await pathology results.                    - Return to GI clinic.                    - The findings and recommendations were discussed with the                     patient.                    - The findings and recommendations were discussed with the                     patient's family. Procedure Code(s): --- Professional ---                    [phone], Esophagogastroduodenoscopy, flexible, transoral;                     with biopsy, single or multiple CPT copyright 2014 American Medical Association. All rights reserved. The codes documented in this report are preliminary and upon coder review may  be revised to meet current compliance requirements. Kathalene Frames, MD 07/24/2015 10:23:57 AM This report has been signed electronically. Number of Addenda: 0 Note Initiated On: 07/24/2015 9:57 AM      [facility]

## 2015-07-24 NOTE — Anesthesia Preprocedure Evaluation (Signed)
Anesthesia Evaluation    Airway Mallampati: II       Dental  (+) Teeth Intact   Pulmonary  breath sounds clear to auscultation        Cardiovascular hypertension, Rate:Normal     Neuro/Psych  Headaches,    GI/Hepatic GERD-  ,  Endo/Other    Renal/GU      Musculoskeletal   Abdominal   Peds  Hematology   Anesthesia Other Findings   Reproductive/Obstetrics                             Anesthesia Physical Anesthesia Plan  ASA: II  Anesthesia Plan: General   Post-op Pain Management:    Induction:   Airway Management Planned:   Additional Equipment:   Intra-op Plan:   Post-operative Plan:   Informed Consent:   Plan Discussed with:   Anesthesia Plan Comments:         Anesthesia Quick Evaluation

## 2015-07-24 NOTE — H&P (Signed)
Primary Care Physician:  Jerl Mina, MD  Pre-Procedure History & Physical: HPI:  Alexandria Sweeney is a 34 y.o. female is here for an endoscopy.   Past Medical History  Diagnosis Date  . Hypertension   . Asthma   . Allergic rhinitis   . Headache   . GERD (gastroesophageal reflux disease)   . Irregular periods/menstrual cycles     Past Surgical History  Procedure Laterality Date  . Nasal sinus surgery    . Thumb surgery      Prior to Admission medications   Medication Sig Start Date End Date Taking? Authorizing Provider  albuterol (PROAIR HFA) 108 (90 BASE) MCG/ACT inhaler Inhale 2 puffs into the lungs every 6 (six) hours as needed for wheezing. 10/21/14  Yes Lupita Leash, MD  B-Complex TABS Place 1 tablet under the tongue daily.   Yes Historical Provider, MD  budesonide (PULMICORT) 180 MCG/ACT inhaler Inhale 1 puff into the lungs 2 (two) times daily. 07/11/14  Yes Lupita Leash, MD  cetirizine (ZYRTEC) 10 MG tablet Take 10 mg by mouth daily.   Yes Historical Provider, MD  dicyclomine (BENTYL) 10 MG capsule Take 10 mg by mouth 2 (two) times daily.   Yes Historical Provider, MD  diltiazem (CARDIZEM CD) 120 MG 24 hr capsule Take 120 mg by mouth daily.    Yes Historical Provider, MD  fluticasone (FLONASE) 50 MCG/ACT nasal spray Place into both nostrils daily.   Yes Historical Provider, MD  hydrochlorothiazide (HYDRODIURIL) 25 MG tablet Take 25 mg by mouth daily.   Yes Historical Provider, MD  Linaclotide Karlene Einstein) 145 MCG CAPS capsule Take 145 mcg by mouth daily.   Yes Historical Provider, MD  montelukast (SINGULAIR) 10 MG tablet Take 1 tablet (10 mg total) by mouth at bedtime. 10/10/14  Yes Lupita Leash, MD  pantoprazole (PROTONIX) 40 MG tablet Take 40 mg by mouth daily.   Yes Historical Provider, MD    Allergies as of 07/19/2015 - Review Complete 07/19/2015  Allergen Reaction Noted  . Nadolol Rash 05/19/2014    Family History  Problem Relation Age of Onset   . Heart disease Mother   . Allergies Father   . Cancer Father   . Allergies Sister     Social History   Social History  . Marital Status: Single    Spouse Name: N/A  . Number of Children: N/A  . Years of Education: N/A   Occupational History  . Not on file.   Social History Main Topics  . Smoking status: Never Smoker   . Smokeless tobacco: Never Used  . Alcohol Use: No  . Drug Use: No  . Sexual Activity: Not on file   Other Topics Concern  . Not on file   Social History Narrative     Physical Exam: BP 131/92 mmHg  Pulse 69  Temp(Src) 97.2 F (36.2 C) (Tympanic)  Resp 16  Ht  (1.575 m)  Wt 67.586 kg (149 lb)  BMI 27.25 kg/m2  SpO2 99%  LMP  General:   Alert,  pleasant and cooperative in NAD Head:  Normocephalic and atraumatic. Neck:  Supple; no masses or thyromegaly. Lungs:  Clear throughout to auscultation.    Heart:  Regular rate and rhythm. Abdomen:  Soft, nontender and nondistended. Normal bowel sounds, without guarding, and without rebound.   Neurologic:  Alert and  oriented x4;  grossly normal neurologically.  Impression/Plan: Alexandria Sweeney is here for an endoscopy to be performed for f/u  hyperrplastic gastric polyp, epigastric pain, bloating  Risks, benefits, limitations, and alternatives regarding  endoscopy have been reviewed with the patient.  Questions have been answered.  All parties agreeable.   Elnita Maxwell, MD  07/24/2015, 10:02 AM

## 2015-07-25 LAB — SURGICAL PATHOLOGY

## 2015-07-26 ENCOUNTER — Encounter: Payer: Self-pay | Admitting: Gastroenterology

## 2015-08-09 ENCOUNTER — Telehealth: Payer: Self-pay | Admitting: Pulmonary Disease

## 2015-08-09 MED ORDER — ALBUTEROL SULFATE HFA 108 (90 BASE) MCG/ACT IN AERS: 2.0000 | INHALATION_SPRAY | Freq: Four times a day (QID) | RESPIRATORY_TRACT | Status: DC | PRN

## 2015-08-09 MED ORDER — BUDESONIDE 180 MCG/ACT IN AEPB: 1.0000 | INHALATION_SPRAY | Freq: Two times a day (BID) | RESPIRATORY_TRACT | Status: DC

## 2015-08-09 NOTE — Telephone Encounter (Signed)
Called and spoke with pt Informed that refills would be sent to [facility] as requested  Nothing further is needed

## 2015-10-20 ENCOUNTER — Other Ambulatory Visit: Payer: Self-pay

## 2015-10-20 MED ORDER — ALBUTEROL SULFATE HFA 108 (90 BASE) MCG/ACT IN AERS: 2.0000 | INHALATION_SPRAY | Freq: Four times a day (QID) | RESPIRATORY_TRACT | Status: DC | PRN

## 2015-10-20 MED ORDER — BUDESONIDE 180 MCG/ACT IN AEPB: 1.0000 | INHALATION_SPRAY | Freq: Two times a day (BID) | RESPIRATORY_TRACT | Status: DC

## 2015-10-20 NOTE — Telephone Encounter (Signed)
90 day supply

## 2015-10-20 NOTE — Telephone Encounter (Signed)
Pt informed prescriptions have been sent. Nothing further needed.

## 2015-12-15 ENCOUNTER — Other Ambulatory Visit: Payer: Self-pay | Admitting: Pulmonary Disease

## 2015-12-18 ENCOUNTER — Other Ambulatory Visit: Payer: Self-pay | Admitting: Pulmonary Disease

## 2016-06-03 ENCOUNTER — Ambulatory Visit (INDEPENDENT_AMBULATORY_CARE_PROVIDER_SITE_OTHER): Payer: BLUE CROSS/BLUE SHIELD | Admitting: Pulmonary Disease

## 2016-06-03 ENCOUNTER — Encounter: Payer: Self-pay | Admitting: Pulmonary Disease

## 2016-06-03 VITALS — BP 132/82 | HR 80 | Wt 141.0 lb

## 2016-06-03 DIAGNOSIS — J4520 Mild intermittent asthma, uncomplicated: Secondary | ICD-10-CM

## 2016-06-10 NOTE — Progress Notes (Signed)
PROBLEMS: Mild persistent asthma  INTERVAL HISTORY: No major problems  SUBJ: No new complaints. Minimal use of rescue medicine  OBJ: Filed Vitals:   06/03/16 0854  BP: 132/82  Pulse: 80  Weight: 141 lb (63.957 kg)  SpO2: 97%   NAD No JVD BS full, no wheezes RRR s M NABS, soft No C/C/E  DATA: No new CXR or PFTs  IMPRESSION: Mild persistent asthma  PLAN: Continue Pulmicort inhaler and montelukast (prescribed for allergies) ROV PRN  Billy Fischeravid Jerzey Komperda, MD PCCM service Mobile [phone] Pager [phone] 06/10/2016

## 2016-07-15 ENCOUNTER — Telehealth: Payer: Self-pay | Admitting: Pulmonary Disease

## 2016-07-15 ENCOUNTER — Other Ambulatory Visit: Payer: Self-pay | Admitting: Pulmonary Disease

## 2016-07-15 MED ORDER — MONTELUKAST SODIUM 10 MG PO TABS: ORAL_TABLET | ORAL | 2 refills | Status: DC

## 2016-07-15 NOTE — Telephone Encounter (Signed)
°*  STAT* If patient is at the pharmacy, call can be transferred to refill team.   1. Which medications need to be refilled? (please list name of each medication and dose if known)  Montelukast 10 mg   2. Which pharmacy/location (including street and city if local pharmacy) is medication to be sent to? [facility]  3. Do they need a 30 day or 90 day supply?  90 day

## 2016-07-15 NOTE — Telephone Encounter (Signed)
RX sent. Nothing further needed. 

## 2016-12-30 ENCOUNTER — Telehealth: Payer: Self-pay | Admitting: Pulmonary Disease

## 2016-12-30 MED ORDER — BUDESONIDE 180 MCG/ACT IN AEPB: INHALATION_SPRAY | RESPIRATORY_TRACT | 0 refills | Status: DC

## 2016-12-30 NOTE — Telephone Encounter (Signed)
Spoke with pt. And rx was sent to her pharmacy of choice. Looks like she had a change in mail order services. Her medication was sent. Nothing further is needed at this time.

## 2017-01-02 ENCOUNTER — Telehealth: Payer: Self-pay | Admitting: *Deleted

## 2017-01-02 NOTE — Telephone Encounter (Signed)
Calling regarding an outcome for PA for pulmicort. Denied

## 2017-01-02 NOTE — Telephone Encounter (Signed)
Submitted PA for Pulmicort Flexihaler thru [facility].  Key: [id number]  Will await response.

## 2017-01-03 NOTE — Telephone Encounter (Signed)
Pulmicort has been denied. Alternative meds are Asmanex and Qvar. Please advise.

## 2017-01-10 MED ORDER — BECLOMETHASONE DIPROPIONATE 80 MCG/ACT IN AERS: 1.0000 | INHALATION_SPRAY | Freq: Two times a day (BID) | RESPIRATORY_TRACT | 10 refills | Status: DC

## 2017-01-10 NOTE — Telephone Encounter (Signed)
Pt informed RX for Qvar has been sent to pharmacy. Nothing further needed.

## 2017-01-10 NOTE — Telephone Encounter (Signed)
I have placed order for Qvar 80 mcg strength - one inhalation twice a day  Theodoro Gristave

## 2017-02-26 ENCOUNTER — Telehealth: Payer: Self-pay | Admitting: Pulmonary Disease

## 2017-02-26 MED ORDER — BECLOMETHASONE DIPROPIONATE 80 MCG/ACT IN AERS: 1.0000 | INHALATION_SPRAY | Freq: Two times a day (BID) | RESPIRATORY_TRACT | 10 refills | Status: DC

## 2017-02-26 NOTE — Telephone Encounter (Signed)
RX sent to [facility]. Pt informed.

## 2017-02-26 NOTE — Telephone Encounter (Signed)
°*  STAT* If patient is at the pharmacy, call can be transferred to refill team.   1. Which medications need to be refilled? (please list name of each medication and dose if known) qvar   2. Which pharmacy/location (including street and city if local pharmacy) is medication to be sent to? [facility]   3. Do they need a 30 day or 90 day supply? 30 day

## 2017-03-31 ENCOUNTER — Other Ambulatory Visit: Payer: Self-pay

## 2017-03-31 MED ORDER — ALBUTEROL SULFATE HFA 108 (90 BASE) MCG/ACT IN AERS: 2.0000 | INHALATION_SPRAY | Freq: Four times a day (QID) | RESPIRATORY_TRACT | 0 refills | Status: DC | PRN

## 2017-03-31 MED ORDER — MONTELUKAST SODIUM 10 MG PO TABS: ORAL_TABLET | ORAL | 2 refills | Status: DC

## 2017-07-28 ENCOUNTER — Ambulatory Visit: Payer: Self-pay | Admitting: Pulmonary Disease

## 2017-10-28 ENCOUNTER — Ambulatory Visit: Payer: Self-pay | Admitting: Pulmonary Disease

## 2017-11-05 ENCOUNTER — Telehealth: Payer: Self-pay | Admitting: *Deleted

## 2017-11-05 ENCOUNTER — Telehealth: Payer: Self-pay | Admitting: Pulmonary Disease

## 2017-11-05 NOTE — Telephone Encounter (Signed)
Pt calling stating she's having to use her rescue inhaler and when she does its not getting any better.   She is SOB and worried.   She is coming on Monday but wanted to come in sooner  Please advise

## 2017-11-05 NOTE — Telephone Encounter (Signed)
Pt called with acute asthma exacerbation. Pt was offered appt tomorrow and she declined. Pt states she will continue to use rescue inhaler if sx persist she will go to ED. Pt has appt 12/10 with DS.

## 2017-11-06 NOTE — Telephone Encounter (Signed)
Returned call to patient and appt has been scheduled.ss

## 2017-11-06 NOTE — Telephone Encounter (Signed)
Pt calling asking if she can come in and see any provider She states she should have taken the appointment today The reason she would like to come due to weather being bad.   Please advise

## 2017-11-06 NOTE — Telephone Encounter (Signed)
will close this encounter due to we have opened an encounter when pt was transferred to our phone to discuss issues.

## 2017-11-07 ENCOUNTER — Encounter: Payer: Self-pay | Admitting: Internal Medicine

## 2017-11-07 ENCOUNTER — Ambulatory Visit (INDEPENDENT_AMBULATORY_CARE_PROVIDER_SITE_OTHER): Payer: BLUE CROSS/BLUE SHIELD | Admitting: Internal Medicine

## 2017-11-07 VITALS — BP 142/88 | HR 80 | Resp 16 | Ht 62.0 in | Wt 141.0 lb

## 2017-11-07 DIAGNOSIS — J45909 Unspecified asthma, uncomplicated: Secondary | ICD-10-CM | POA: Diagnosis not present

## 2017-11-07 DIAGNOSIS — N926 Irregular menstruation, unspecified: Secondary | ICD-10-CM | POA: Insufficient documentation

## 2017-11-07 MED ORDER — BUDESONIDE-FORMOTEROL FUMARATE 160-4.5 MCG/ACT IN AERO: 2.0000 | INHALATION_SPRAY | Freq: Two times a day (BID) | RESPIRATORY_TRACT | 12 refills | Status: DC

## 2017-11-07 NOTE — Patient Instructions (Addendum)
Stop Qvar.  Start sample of symbicort 2 puffs twice daily. If you feel jittery, cut back to 1 puff twice daily. Rinse mouth after use.

## 2017-11-07 NOTE — Progress Notes (Signed)
PROBLEMS: Mild persistent asthma  INTERVAL HISTORY: No major problems  SUBJ: For the past couple of weeks she has had trouble getting air in. She is using her rescue inhaler about twice per day but only gets 45 minutes of relief. She prefers not to take it because it makes her jittery.  She denies reflux, she takes pantoprazole daily. She does have sinus drainage, she takes singulair, flonase, zyrtec.  She has no pets, she does not smoke, she does not live with a smoker. No new house, carpet, no new furniture or bedding.   She is taking qvar 1 puff twice daily. She used to take pulmicort nebs but her insurance did not cover it.  She has been tested for allergies and was allergic to cats, trees, grass. She has had allergy shots which did not help. She has had 2 flare ups of asthma over the past year.    07/07/15; CBC eos=110.    OBJ: Vitals:   11/07/17 0907 11/07/17 0914  BP:  (!) 142/88  Pulse:  80  Resp: 16   SpO2:  97%  Weight: 141 lb (64 kg)   Height: 5\' 2"  (1.575 m)    NAD No JVD BS full, no wheezes RRR s M NABS, soft No C/C/E  DATA: No new CXR or PFTs  IMPRESSION: Persistent asthma with daily rescue inhaler use. It does not appear that she is having an exacerbation, just that her asthma control has declined.   PLAN: She is started on symbicort 2 puffs bid, she is given a sample today. She is asked to cut down to 1 puff twice daily if it makes her jittery.  Stop qvar.  Continue montelukast (prescribed for allergies)   Deep Nicholos Johnsamachandran, MD.   Board Certified in Internal Medicine, Pulmonary Medicine, Critical Care Medicine, and Sleep Medicine.  [facility] Office Number: [phone] Pager: [phone]  Santiago Gladavid Kasa, M.D.  Billy Fischeravid Simonds, M.D  Pager [phone] 11/07/2017

## 2017-11-10 ENCOUNTER — Ambulatory Visit: Payer: Self-pay | Admitting: Pulmonary Disease

## 2017-12-16 ENCOUNTER — Ambulatory Visit (INDEPENDENT_AMBULATORY_CARE_PROVIDER_SITE_OTHER): Payer: BLUE CROSS/BLUE SHIELD | Admitting: Internal Medicine

## 2017-12-16 ENCOUNTER — Encounter: Payer: Self-pay | Admitting: Internal Medicine

## 2017-12-16 ENCOUNTER — Ambulatory Visit
Admission: RE | Admit: 2017-12-16 | Discharge: 2017-12-16 | Disposition: A | Discharge status: Home / Self Care | Payer: BLUE CROSS/BLUE SHIELD | Source: Ambulatory Visit | Attending: Internal Medicine | Admitting: Internal Medicine

## 2017-12-16 VITALS — BP 142/90 | HR 86 | Ht 62.0 in | Wt 140.0 lb

## 2017-12-16 DIAGNOSIS — R05 Cough: Secondary | ICD-10-CM

## 2017-12-16 DIAGNOSIS — J45909 Unspecified asthma, uncomplicated: Secondary | ICD-10-CM

## 2017-12-16 DIAGNOSIS — J3089 Other allergic rhinitis: Secondary | ICD-10-CM | POA: Diagnosis not present

## 2017-12-16 DIAGNOSIS — R059 Cough, unspecified: Secondary | ICD-10-CM

## 2017-12-16 MED ORDER — BENZONATATE 100 MG PO CAPS: 200.0000 mg | ORAL_CAPSULE | Freq: Three times a day (TID) | ORAL | 0 refills | Status: DC

## 2017-12-16 NOTE — Patient Instructions (Addendum)
Take OTC cough syrup such as mucinex-DM.  Use your rescue inhaler more often, 2 puffs as needed.  If not feeling better in 1 week, call us back for another prednisone course.

## 2017-12-16 NOTE — Progress Notes (Addendum)
PROBLEMS: Mild persistent asthma  INTERVAL HISTORY: cough  SUBJ: Patient presents back with symptoms of sinus drainage, pink eye, cough with hoarseness, year pain.  She presented to urgent care with above symptoms.  She was given a course of cephalexin, prednisone taper, pseudoephedrine. The symptoms started about 5 days ago, she is feeling better but the cough has persisted.  She is taking symbicort 2 puffs bid, proair once daily, she does not have nebs.    07/07/15; CBC eos=110.  Addendum 12/16/17; CXR image personally reviewed, no acute change. Pt informed via telephone.    OBJ: Vitals:   12/16/17 0924 12/16/17 0931  BP:  (!) 142/90  Pulse:  86  SpO2:  96%  Weight: 140 lb (63.5 kg)   Height: 5\' 2"  (1.575 m)    NAD No JVD BS full, no wheezes RRR s M NABS, soft No C/C/E  DATA: No new CXR or PFTs  IMPRESSION: Acute asthmatic bronchitis with persistent cough. Acute otitis, acute rhinitis. Acute flulike illness.  PLAN: Continue Symbicort Symbicort 2 puffs bid. Continue montelukast (prescribed for allergies) She has been given a prescription already for prednisone taper, cephalexin. She was given a prescription for Tessalon, will also send for chest x-ray to rule out pneumonia. She is asked to use her albuterol rescue inhaler more often, 2 puffs as needed. -She is asked to call us back in 1 week if not improved, if not we may need to send another prescription for prednisone taper.  She is also provided reassurance that her cough will likely persist for a few weeks.   Wells Guileseep Karlin Binion, MD.   Board Certified in Internal Medicine, Pulmonary Medicine, Critical Care Medicine, and Sleep Medicine.  [facility] Office Number: [phone] Pager: [phone]  Santiago Gladavid Kasa, M.D.  Billy Fischeravid Simonds, M.D  Pager [phone] 12/16/2017

## 2017-12-19 ENCOUNTER — Telehealth: Payer: Self-pay | Admitting: Internal Medicine

## 2017-12-19 ENCOUNTER — Other Ambulatory Visit: Payer: Self-pay | Admitting: Internal Medicine

## 2017-12-19 MED ORDER — PREDNISONE 10 MG (21) PO TBPK
ORAL_TABLET | ORAL | 0 refills | Status: DC
Start: 2017-12-19 — End: 2018-03-04

## 2017-12-19 NOTE — Telephone Encounter (Signed)
Patient aware.

## 2017-12-19 NOTE — Telephone Encounter (Signed)
Patient calling back to let us know that after her acute visit she is feeling a little better but not quite back to baseline.  Patient wants us to call in an abx and prednisone

## 2017-12-19 NOTE — Telephone Encounter (Signed)
Called in confused with where rx was called in > will go to the [facility] to pick it up

## 2017-12-19 NOTE — Telephone Encounter (Signed)
Sent in script for prednisone taper. The abx is likely unnecessary as this is probably residual symptoms from a viral infection. If her symptoms do not improve in the next week we should check a sputum culture.

## 2018-02-21 ENCOUNTER — Other Ambulatory Visit: Payer: Self-pay | Admitting: Pulmonary Disease

## 2018-03-04 ENCOUNTER — Ambulatory Visit (INDEPENDENT_AMBULATORY_CARE_PROVIDER_SITE_OTHER): Payer: BLUE CROSS/BLUE SHIELD | Admitting: Pulmonary Disease

## 2018-03-04 ENCOUNTER — Encounter: Payer: Self-pay | Admitting: Pulmonary Disease

## 2018-03-04 VITALS — BP 118/78 | HR 71 | Ht 62.0 in | Wt 142.0 lb

## 2018-03-04 DIAGNOSIS — Z889 Allergy status to unspecified drugs, medicaments and biological substances status: Secondary | ICD-10-CM | POA: Diagnosis not present

## 2018-03-04 DIAGNOSIS — J4530 Mild persistent asthma, uncomplicated: Secondary | ICD-10-CM | POA: Diagnosis not present

## 2018-03-04 NOTE — Patient Instructions (Signed)
Continue Symbicort inhaler, montelukast (Singulair), cetirizine (Zyrtec) as maintenance medications Continue albuterol as needed for increased shortness of breath, cough, wheezing, chest tightness Follow-up in 6 months or sooner as needed for worsening of asthma symptoms

## 2018-03-04 NOTE — Progress Notes (Signed)
PROBLEMS: Mild or moderate persistent asthma Atopy  INTERVAL HISTORY: Last seen by me in 2017. Seen 11/2017 by DR with increased asthma symptoms and Qvar changed to Symbicort. Seen again by DR 12/2017 with symptoms of otitis, rhinitis and bronchitis. Treated with prednisone and cephalexin  SUBJ: Recent events as noted above.  Presently she is doing very well and is very satisfied with her asthma control.  She remains on Symbicort, montelukast and cetirizine for asthma and atopic symptoms.  She has not used her albuterol rescue inhaler in several weeks.  She has no new complaints. Denies CP, fever, purulent sputum, hemoptysis, LE edema and calf tenderness.   OBJ: Vitals:   03/04/18 0832 03/04/18 0838  BP:  118/78  Pulse:  71  SpO2:  92%  Weight: 142 lb (64.4 kg)   Height: 5\' 2"  (1.575 m)      Recheck SpO2 99%  Gen: NAD HEENT: NCAT, sclerae white Neck: No LAN, no JVD noted Lungs: full BS, no wheezes or other adventitious sounds Cardiovascular: RRR, no M noted Abdomen: Soft, NT, +BS Ext: no C/C/E Neuro: grossly intact Skin: No lesions noted   DATA: No flowsheet data found.   No flowsheet data found.   CXR 12/16/17: [facility]  IMPRESSION: Mild-mod persistent asthma with atopy  PLAN: Continue Symbicort inhaler, montelukast (Singulair), cetirizine (Zyrtec) as maintenance medications Continue albuterol as needed for increased shortness of breath, cough, wheezing, chest tightness Follow-up in 6 months or sooner as needed for worsening of asthma symptoms  Billy Fischeravid Simonds, MD PCCM service Mobile [phone] Pager [phone] 03/04/2018 8:53 AM

## 2018-06-30 DIAGNOSIS — K9041 Non-celiac gluten sensitivity: Secondary | ICD-10-CM | POA: Insufficient documentation

## 2018-08-20 ENCOUNTER — Encounter: Payer: Self-pay | Admitting: Pulmonary Disease

## 2018-08-20 ENCOUNTER — Ambulatory Visit (INDEPENDENT_AMBULATORY_CARE_PROVIDER_SITE_OTHER): Payer: BLUE CROSS/BLUE SHIELD | Admitting: Pulmonary Disease

## 2018-08-20 VITALS — BP 138/98 | HR 75 | Ht 60.75 in | Wt 143.2 lb

## 2018-08-20 DIAGNOSIS — J4530 Mild persistent asthma, uncomplicated: Secondary | ICD-10-CM | POA: Diagnosis not present

## 2018-08-20 MED ORDER — ALBUTEROL SULFATE HFA 108 (90 BASE) MCG/ACT IN AERS: 2.0000 | INHALATION_SPRAY | Freq: Four times a day (QID) | RESPIRATORY_TRACT | 10 refills | Status: DC | PRN

## 2018-08-20 MED ORDER — BUDESONIDE-FORMOTEROL FUMARATE 80-4.5 MCG/ACT IN AERO: 2.0000 | INHALATION_SPRAY | Freq: Two times a day (BID) | RESPIRATORY_TRACT | 12 refills | Status: DC

## 2018-08-20 NOTE — Patient Instructions (Addendum)
Change Symbicort strength to 80-4.5.  You may decrease to 1 puff twice a day on the current strength Symbicort until it is gone  Continue Singulair (montelukast) and Zyrtec (cetirizine) as previously prescribed  Continue albuterol (ProAir) as needed.  Prescription refilled  Follow-up in 6 months.  Call sooner as needed.  If your asthma control remains excellent at that time, we can consider changing Symbicort to a simple inhaled corticosteroid

## 2018-08-20 NOTE — Progress Notes (Signed)
PROBLEMS: Mild or moderate persistent asthma Atopy  INTERVAL HISTORY: Last visit 03/04/2018.  No major events  SUBJ: This is a scheduled follow-up.  She has no new complaints.  She remains on Symbicort, montelukast, cetirizine for asthma and allergy symptoms.  She has used her albuterol rescue inhaler approximately 2 times in the past 6 months.  She has no new complaints. Denies CP, fever, purulent sputum, hemoptysis, LE edema and calf tenderness.   OBJ: Vitals:   08/20/18 0823  BP: (!) 138/98  Pulse: 75  SpO2: 99%  Weight: 143 lb 3.2 oz (65 kg)  Height: 5' 0.75" (1.543 m)  RA  Gen: NAD HEENT: NCAT, sclera white Neck: No JVD Lungs: breath sounds full, no wheezes or other adventitious sounds Cardiovascular: RRR, no murmurs Abdomen: Soft, nontender, normal BS Ext: without clubbing, cyanosis, edema Neuro: grossly intact Skin: Limited exam, no lesions noted    DATA: No flowsheet data found.   No flowsheet data found.   CXR: NNF  IMPRESSION: Mild persistent asthma with atopy -her current control is excellent.  She should not require high dose ICS to maintain this control.  PLAN: Change Symbicort to 80-4.5 strength.  Continue 2 actuations twice a day.  She is reminded to rinse her mouth after use.  I instructed that she may continue to use the 160 strength until gone and suggested that she use 1 actuation twice a day.  Continue montelukast and cetirizine as previously prescribed  Continue albuterol as needed  Follow-up in 6 months.  She is to call sooner as needed.  If her asthma control remains excellent at that time, we can consider changing Symbicort to a simple ICS  [place]andria Fischeravid Kirandeep Fariss, MD PCCM service Mobile [phone] Pager [phone] 08/20/2018 8:37 AM

## 2018-10-22 ENCOUNTER — Other Ambulatory Visit: Payer: Self-pay | Admitting: Pulmonary Disease

## 2018-11-02 ENCOUNTER — Other Ambulatory Visit: Payer: Self-pay | Admitting: Obstetrics & Gynecology

## 2018-11-02 DIAGNOSIS — Z1231 Encounter for screening mammogram for malignant neoplasm of breast: Secondary | ICD-10-CM

## 2018-12-25 ENCOUNTER — Ambulatory Visit
Admission: RE | Admit: 2018-12-25 | Discharge: 2018-12-25 | Disposition: A | Discharge status: Home / Self Care | Payer: BLUE CROSS/BLUE SHIELD | Source: Ambulatory Visit | Attending: Obstetrics & Gynecology | Admitting: Obstetrics & Gynecology

## 2018-12-25 DIAGNOSIS — Z1231 Encounter for screening mammogram for malignant neoplasm of breast: Secondary | ICD-10-CM | POA: Insufficient documentation

## 2019-02-01 ENCOUNTER — Other Ambulatory Visit: Payer: Self-pay | Admitting: Internal Medicine

## 2019-02-04 ENCOUNTER — Other Ambulatory Visit: Payer: Self-pay | Admitting: Internal Medicine

## 2019-02-11 ENCOUNTER — Other Ambulatory Visit: Payer: Self-pay | Admitting: Pulmonary Disease

## 2019-03-16 ENCOUNTER — Other Ambulatory Visit: Payer: Self-pay | Admitting: Pulmonary Disease

## 2019-03-22 ENCOUNTER — Encounter: Payer: Self-pay | Admitting: Pulmonary Disease

## 2019-03-22 ENCOUNTER — Ambulatory Visit (INDEPENDENT_AMBULATORY_CARE_PROVIDER_SITE_OTHER): Payer: Managed Care, Other (non HMO) | Admitting: Pulmonary Disease

## 2019-03-22 DIAGNOSIS — Z889 Allergy status to unspecified drugs, medicaments and biological substances status: Secondary | ICD-10-CM

## 2019-03-22 DIAGNOSIS — J4540 Moderate persistent asthma, uncomplicated: Secondary | ICD-10-CM

## 2019-03-22 DIAGNOSIS — J302 Other seasonal allergic rhinitis: Secondary | ICD-10-CM

## 2019-03-22 MED ORDER — BUDESONIDE-FORMOTEROL FUMARATE 160-4.5 MCG/ACT IN AERO: 2.0000 | INHALATION_SPRAY | Freq: Two times a day (BID) | RESPIRATORY_TRACT | 10 refills | Status: DC

## 2019-03-22 NOTE — Progress Notes (Signed)
Virtual Visit via Telephone Note  I connected with Alexandria Sweeney on 03/22/19 at  9:45 AM EDT by telephone and verified that I am speaking with the correct person using two identifiers.   I discussed the limitations, risks, security and privacy concerns of performing an evaluation and management service by telephone and the availability of in person appointments. I also discussed with the patient that there may be a patient responsible charge related to this service. The patient expressed understanding and agreed to proceed.  PROBLEMS: Moderate persistent asthma Seasonal allergies Atopy  History of Present Illness: Last visit, I recommend that she decrease the strength of Symbicort to the 80-4.5 strength.  However, she noted increased dyspnea and increased use of albuterol rescue inhaler.  Therefore, she has gone back to the 160-4.5 strength. Now she is using albuterol rarely.  She remains on cetirizine and montelukast.  During these heavy pollen months, she has had some difficulty with nasal congestion sinus headaches.  She is also on Flonase inhaler.  Overall, her asthma is well controlled.   Observations/Objective: No apparent dyspnea on during phone encounter  Assessment and Plan: Moderate persistent asthma without complication  Seasonal allergies  Atopy  Continue Symbicort 160-4.5, 2 actuations twice daily.  Rinse mouth after use Continue cetirizine and montelukast daily Continue Flonase inhaler She may use Sudafed or equivalent as needed for sinus congestion and headaches I cautioned not to use Sudafed before sleep as it might disrupt sleep quality I changed her prescription to the 160 strength   Follow Up Instructions: Follow-up in 6 months.  Call sooner if needed   I discussed the assessment and treatment plan with the patient. The patient was provided an opportunity to ask questions and all were answered. The patient agreed with the plan and demonstrated an  understanding of the instructions.   The patient was advised to call back or seek an in-person evaluation if the symptoms worsen or if the condition fails to improve as anticipated.  I provided 15 minutes of non-face-to-face time during this encounter.   Merwyn Katos, MD

## 2019-06-08 ENCOUNTER — Other Ambulatory Visit: Payer: Self-pay | Admitting: Pulmonary Disease

## 2019-07-26 ENCOUNTER — Telehealth: Payer: Self-pay | Admitting: Pulmonary Disease

## 2019-07-26 NOTE — Telephone Encounter (Signed)
Pt is requesting Rx for PNA vaccine to be sent to [facility].   DS please advise if okay to order? Thanks

## 2019-07-26 NOTE — Telephone Encounter (Signed)
OK to place order. Thanks  Waunita Schooner

## 2019-07-26 NOTE — Telephone Encounter (Signed)
Just to verify pneumococcal 23?

## 2019-07-27 NOTE — Telephone Encounter (Signed)
Pt is aware that Rx has been sent to [facility]. Nothing further is needed.  

## 2019-07-27 NOTE — Telephone Encounter (Signed)
yes

## 2019-07-28 NOTE — Telephone Encounter (Signed)
Contacted [facility] and gave verbal to Laura for Pneumo 23.

## 2019-08-05 ENCOUNTER — Other Ambulatory Visit: Payer: Self-pay | Admitting: Pulmonary Disease

## 2019-08-05 ENCOUNTER — Telehealth: Payer: Self-pay | Admitting: Pulmonary Disease

## 2019-08-05 NOTE — Telephone Encounter (Signed)
Pt is aware per our records she last has Prevnar 13 08/16/14. She is aware that she is due for pneumo 23. Pt voiced her understanding and had no further questions.  Nothing further is needed.

## 2019-08-23 ENCOUNTER — Telehealth: Payer: Self-pay | Admitting: Pulmonary Disease

## 2019-08-23 NOTE — Telephone Encounter (Signed)
Called and spoke to pt, who is requesting recommendations for new provider.  Pt stated that she would like to schedule phone visit with Dr. Mortimer Fries. Pt has been scheduled for 09/15/2019 at 10:00. Nothing further is needed.

## 2019-09-02 ENCOUNTER — Telehealth: Payer: Self-pay | Admitting: Internal Medicine

## 2019-09-02 MED ORDER — BUDESONIDE-FORMOTEROL FUMARATE 160-4.5 MCG/ACT IN AERO: 2.0000 | INHALATION_SPRAY | Freq: Two times a day (BID) | RESPIRATORY_TRACT | 1 refills | Status: DC

## 2019-09-02 NOTE — Telephone Encounter (Signed)
Rx for symbicort 160 has been sent to preferred pharmacy.  Pt aware and voiced her understanding.  Noting further is needed.

## 2019-09-15 ENCOUNTER — Other Ambulatory Visit: Payer: Self-pay

## 2019-09-15 ENCOUNTER — Ambulatory Visit (INDEPENDENT_AMBULATORY_CARE_PROVIDER_SITE_OTHER): Payer: BLUE CROSS/BLUE SHIELD | Admitting: Internal Medicine

## 2019-09-15 ENCOUNTER — Encounter: Payer: Self-pay | Admitting: Internal Medicine

## 2019-09-15 DIAGNOSIS — J4520 Mild intermittent asthma, uncomplicated: Secondary | ICD-10-CM | POA: Diagnosis not present

## 2019-09-15 MED ORDER — ADVAIR HFA 115-21 MCG/ACT IN AERO: 2.0000 | INHALATION_SPRAY | Freq: Two times a day (BID) | RESPIRATORY_TRACT | 12 refills | Status: DC

## 2019-09-15 NOTE — Patient Instructions (Addendum)
Continue inhalers as prescribed-changed to ADVAIR [facility] 115 Avoid allergens   Asthma, Adult  Asthma is a long-term (chronic) condition in which the airways get tight and narrow. The airways are the breathing passages that lead from the nose and mouth down into the lungs. A person with asthma will have times when symptoms get worse. These are called asthma attacks. They can cause coughing, whistling sounds when you breathe (wheezing), shortness of breath, and chest pain. They can make it hard to breathe. There is no cure for asthma, but medicines and lifestyle changes can help control it. There are many things that can bring on an asthma attack or make asthma symptoms worse (triggers). Common triggers include:  Mold.  Dust.  Cigarette smoke.  Cockroaches.  Things that can cause allergy symptoms (allergens). These include animal skin flakes (dander) and pollen from trees or grass.  Things that pollute the air. These may include household cleaners, wood smoke, smog, or chemical odors.  Cold air, weather changes, and wind.  Crying or laughing hard.  Stress.  Certain medicines or drugs.  Certain foods such as dried fruit, potato chips, and grape juice.  Infections, such as a cold or the flu.  Certain medical conditions or diseases.  Exercise or tiring activities. Asthma may be treated with medicines and by staying away from the things that cause asthma attacks. Types of medicines may include:  Controller medicines. These help prevent asthma symptoms. They are usually taken every day.  Fast-acting reliever or rescue medicines. These quickly relieve asthma symptoms. They are used as needed and provide short-term relief.  Allergy medicines if your attacks are brought on by allergens.  Medicines to help control the body's defense (immune) system. Follow these instructions at home: Avoiding triggers in your home  Change your heating and air conditioning filter often.  Limit your  use of fireplaces and wood stoves.  Get rid of pests (such as roaches and mice) and their droppings.  Throw away plants if you see mold on them.  Clean your floors. Dust regularly. Use cleaning products that do not smell.  Have someone vacuum when you are not home. Use a vacuum cleaner with a HEPA filter if possible.  Replace carpet with wood, tile, or vinyl flooring. Carpet can trap animal skin flakes and dust.  Use allergy-proof pillows, mattress covers, and box spring covers.  Wash bed sheets and blankets every week in hot water. Dry them in a dryer.  Keep your bedroom free of any triggers.  Avoid pets and keep windows closed when things that cause allergy symptoms are in the air.  Use blankets that are made of polyester or cotton.  Clean bathrooms and kitchens with bleach. If possible, have someone repaint the walls in these rooms with mold-resistant paint. Keep out of the rooms that are being cleaned and painted.  Wash your hands often with soap and water. If soap and water are not available, use hand sanitizer.  Do not allow anyone to smoke in your home. General instructions  Take over-the-counter and prescription medicines only as told by your doctor. ? Talk with your doctor if you have questions about how or when to take your medicines. ? Make note if you need to use your medicines more often than usual.  Do not use any products that contain nicotine or tobacco, such as cigarettes and e-cigarettes. If you need help quitting, ask your doctor.  Stay away from secondhand smoke.  Avoid doing things outdoors when allergen counts are  high and when air quality is low.  Wear a ski mask when doing outdoor activities in the winter. The mask should cover your nose and mouth. Exercise indoors on cold days if you can.  Warm up before you exercise. Take time to cool down after exercise.  Use a peak flow meter as told by your doctor. A peak flow meter is a tool that measures how  well the lungs are working.  Keep track of the peak flow meter's readings. Write them down.  Follow your asthma action plan. This is a written plan for taking care of your asthma and treating your attacks.  Make sure you get all the shots (vaccines) that your doctor recommends. Ask your doctor about a flu shot and a pneumonia shot.  Keep all follow-up visits as told by your doctor. This is important. Contact a doctor if:  You have wheezing, shortness of breath, or a cough even while taking medicine to prevent attacks.  The mucus you cough up (sputum) is thicker than usual.  The mucus you cough up changes from clear or white to yellow, green, gray, or bloody.  You have problems from the medicine you are taking, such as: ? A rash. ? Itching. ? Swelling. ? Trouble breathing.  You need reliever medicines more than 2-3 times a week.  Your peak flow reading is still at 50-79% of your personal best after following the action plan for 1 hour.  You have a fever. Get help right away if:  You seem to be worse and are not responding to medicine during an asthma attack.  You are short of breath even at rest.  You get short of breath when doing very little activity.  You have trouble eating, drinking, or talking.  You have chest pain or tightness.  You have a fast heartbeat.  Your lips or fingernails start to turn blue.  You are light-headed or dizzy, or you faint.  Your peak flow is less than 50% of your personal best.  You feel too tired to breathe normally. Summary  Asthma is a long-term (chronic) condition in which the airways get tight and narrow. An asthma attack can make it hard to breathe.  Asthma cannot be cured, but medicines and lifestyle changes can help control it.  Make sure you understand how to avoid triggers and how and when to use your medicines. This information is not intended to replace advice given to you by your health care provider. Make sure you  discuss any questions you have with your health care provider. Document Released: 05/06/2008 Document Revised: 01/21/2019 Document Reviewed: 12/23/2016 Elsevier Patient Education  2020 [employer].

## 2019-09-15 NOTE — Progress Notes (Signed)
I connected with the patient by telephone enabled telemedicine visit and verified that I am speaking with the correct person using two identifiers.    I discussed the limitations, risks, security and privacy concerns of performing an evaluation and management service by telemedicine and the availability of in-person appointments. I also discussed with the patient that there may be a patient responsible charge related to this service. The patient expressed understanding and agreed to proceed.  PATIENT AGREES AND CONFIRMS -YES   Other persons participating in the visit and their role in the encounter: Patient, nursing  This visit type was conducted due to national recommendations for restrictions regarding the COVID-19 Pandemic (e.g. social distancing).  This format is felt to be most appropriate for this patient at this time.  All issues noted in this document were discussed and addressed.        PROBLEMS: Moderate persistent asthma Seasonal allergies Atopy  CC Follow up ASTHMA Seasonal allergies  ALLERGIES to CATS Carpets in bedroom  HPI Patient currently on Symbicort 160 twice daily Patient attempted to go down in her dosage however she had increased work of breathing and wheezing-patient asking to change to advair due to insurance coverage Patient uses albuterol as needed She remains on cetirizine and montelukast She does have persistent nasal drainage currently spring and fall time She is also on Flonase  At this time her asthma seems to be under control  No exacerbation at this time No evidence of heart failure at this time No evidence or signs of infection at this time No respiratory distress No fevers, chills, nausea, vomiting, diarrhea No evidence of lower extremity edema No evidence hemoptysis     Review of Systems:  Gen:  Denies  fever, sweats, chills weight loss  HEENT: Denies blurred vision, double vision, ear pain, eye pain, hearing loss, nose bleeds, sore  throat Cardiac:  No dizziness, chest pain or heaviness, chest tightness,edema, No JVD Resp:   No cough, -sputum production, -shortness of breath,-wheezing, -hemoptysis,  Gi: Denies swallowing difficulty, stomach pain, nausea or vomiting, diarrhea, constipation, bowel incontinence Gu:  Denies bladder incontinence, burning urine Ext:   Denies Joint pain, stiffness or swelling Skin: Denies  skin rash, easy bruising or bleeding or hives Endoc:  Denies polyuria, polydipsia , polyphagia or weight change Psych:   Denies depression, insomnia or hallucinations  Other:  All other systems negative     Assessment and Plan:  Mild intermittent asthma Well-controlled at this time with Symbicort 162 puffs twice daily patient asking to be switched to [place] as her insurance with cover this medication Patient advised to rinse mouth after use Patient is to avoid allergens  Chronic allergic rhinitis Continue cetirizine and montelukast daily   Instructions to patient-  albuterol as needed if you can't catch your breath by resting or doing a relaxed purse lip breathing - Ok to use up to 2 puffs  every 4 hours if you must but call for immediate appointment if use goes up over your usual need - Don't leave home without it !!     Note your daily symptoms > remember "red flags" for ASTHMA exacerbation >>>Increase in cough >>>increase in sputum production >>>increase in shortness of breath or activity  intolerance.    If you notice these symptoms, please call the office for assessment   COVID-19 EDUCATION: The signs and symptoms of COVID-19 were discussed with the patient and how to seek care for testing.  The importance of social distancing was discussed today.  Hand Washing Techniques and avoid touching face was advised.     MEDICATION ADJUSTMENTS/LABS AND TESTS ORDERED: Continue inhalers as prescribed-will change to advair HFA Avoid allergens    CURRENT MEDICATIONS REVIEWED AT LENGTH WITH  PATIENT TODAY   Patient satisfied with Plan of action and management. All questions answered  Follow up in 6 months  Time spent 25 minutes  Knolan Simien Santiago Glad, M.D.  [facility]  Medical Director [facility] Medical Director [facility] Cardio-Pulmonary Department

## 2019-11-08 ENCOUNTER — Telehealth: Payer: Self-pay | Admitting: Internal Medicine

## 2019-11-08 MED ORDER — MONTELUKAST SODIUM 10 MG PO TABS: ORAL_TABLET | ORAL | 2 refills | Status: DC

## 2019-11-08 MED ORDER — ALBUTEROL SULFATE HFA 108 (90 BASE) MCG/ACT IN AERS: 2.0000 | INHALATION_SPRAY | Freq: Four times a day (QID) | RESPIRATORY_TRACT | 2 refills | Status: DC | PRN

## 2019-11-08 MED ORDER — BUDESONIDE-FORMOTEROL FUMARATE 160-4.5 MCG/ACT IN AERO: 2.0000 | INHALATION_SPRAY | Freq: Two times a day (BID) | RESPIRATORY_TRACT | 3 refills | Status: DC

## 2019-11-08 MED ORDER — MONTELUKAST SODIUM 10 MG PO TABS: ORAL_TABLET | ORAL | 3 refills | Status: DC

## 2019-11-08 NOTE — Telephone Encounter (Signed)
Rx for Symbicort 160, Singulair and proair has been sent to preferred pharmacy. Pt is aware and voiced her understanding.  Nothing further is needed.

## 2019-11-08 NOTE — Telephone Encounter (Signed)
90 day supply of Singulair has been sent to preferred pharmacy. Pt is aware and voiced her understanding.  Nothing further is needed.

## 2020-02-14 ENCOUNTER — Telehealth: Payer: Self-pay | Admitting: Internal Medicine

## 2020-02-14 MED ORDER — MONTELUKAST SODIUM 10 MG PO TABS: ORAL_TABLET | ORAL | 2 refills | Status: DC

## 2020-02-14 MED ORDER — BUDESONIDE-FORMOTEROL FUMARATE 160-4.5 MCG/ACT IN AERO: 2.0000 | INHALATION_SPRAY | Freq: Two times a day (BID) | RESPIRATORY_TRACT | 3 refills | Status: DC

## 2020-02-14 NOTE — Telephone Encounter (Signed)
Rx for Symbicort and Singulair has been sent to preferred pharmacy.  Nothing further is needed.

## 2020-03-23 ENCOUNTER — Encounter: Payer: Self-pay | Admitting: Internal Medicine

## 2020-03-23 ENCOUNTER — Telehealth (INDEPENDENT_AMBULATORY_CARE_PROVIDER_SITE_OTHER): Payer: Managed Care, Other (non HMO) | Admitting: Internal Medicine

## 2020-03-23 DIAGNOSIS — J309 Allergic rhinitis, unspecified: Secondary | ICD-10-CM

## 2020-03-23 DIAGNOSIS — Z7951 Long term (current) use of inhaled steroids: Secondary | ICD-10-CM | POA: Diagnosis not present

## 2020-03-23 DIAGNOSIS — J4520 Mild intermittent asthma, uncomplicated: Secondary | ICD-10-CM | POA: Diagnosis not present

## 2020-03-23 MED ORDER — ALBUTEROL SULFATE HFA 108 (90 BASE) MCG/ACT IN AERS: 2.0000 | INHALATION_SPRAY | RESPIRATORY_TRACT | 10 refills | Status: AC | PRN

## 2020-03-23 MED ORDER — MONTELUKAST SODIUM 10 MG PO TABS: ORAL_TABLET | ORAL | 10 refills | Status: AC

## 2020-03-23 MED ORDER — FLUTICASONE PROPIONATE 50 MCG/ACT NA SUSP: 2.0000 | Freq: Every day | NASAL | 10 refills | Status: AC

## 2020-03-23 MED ORDER — BUDESONIDE-FORMOTEROL FUMARATE 160-4.5 MCG/ACT IN AERO: 2.0000 | INHALATION_SPRAY | Freq: Two times a day (BID) | RESPIRATORY_TRACT | 10 refills | Status: DC

## 2020-03-23 NOTE — Progress Notes (Signed)
I connected with the patient by telephone enabled telemedicine visit and verified that I am speaking with the correct person using two identifiers.    I discussed the limitations, risks, security and privacy concerns of performing an evaluation and management service by telemedicine and the availability of in-person appointments. I also discussed with the patient that there may be a patient responsible charge related to this service. The patient expressed understanding and agreed to proceed.  PATIENT AGREES AND CONFIRMS -YES   Other persons participating in the visit and their role in the encounter: Patient, nursing  This visit type was conducted due to national recommendations for restrictions regarding the COVID-19 Pandemic (e.g. social distancing).  This format is felt to be most appropriate for this patient at this time.  All issues noted in this document were discussed and addressed.       PROBLEMS: Moderate persistent asthma Seasonal allergies Atopy ALLERGIES to CATS Carpets in bedroom   CC Follow-up asthma Follow-up seasonal allergies    HPI Patient currently on Symbicort 160 twice daily  Patient had previously attempted to decrease her dosage however she had increased work of breathing and wheezing   Albuterol as needed  She uses cetirizine and montelukast for allergic rhinitis  Also uses Flonase  At this time her asthma seems to be under control  No exacerbation at this time No evidence of heart failure at this time No evidence or signs of infection at this time No respiratory distress No fevers, chills, nausea, vomiting, diarrhea No evidence of lower extremity edema No evidence hemoptysis    Active Ambulatory Problems    Diagnosis Date Noted  . Intrinsic asthma 10/04/2013  . Allergic rhinitis 10/04/2013  . Acid reflux 10/04/2013  . Arthritis 08/16/2014  . Diffuse connective tissue disease (HCC) 05/18/2014  . Essential hypertension 03/09/2014  . Headache  03/09/2014  . Irregular periods/menstrual cycles 11/07/2017  . Left elbow pain 05/18/2014  . Right foot pain 05/18/2014   Resolved Ambulatory Problems    Diagnosis Date Noted  . No Resolved Ambulatory Problems   Past Medical History:  Diagnosis Date  . Asthma   . GERD (gastroesophageal reflux disease)   . Hypertension      Review of Systems:  Gen:  Denies  fever, sweats, chills weight loss  HEENT: Denies blurred vision, double vision, ear pain, eye pain, hearing loss, nose bleeds, sore throat Cardiac:  No dizziness, chest pain or heaviness, chest tightness,edema, No JVD Resp:   No cough, -sputum production, -shortness of breath,intermittent wheezing, -hemoptysis,  Gi: Denies swallowing difficulty, stomach pain, nausea or vomiting, diarrhea, constipation, bowel incontinence Gu:  Denies bladder incontinence, burning urine Ext:   Denies Joint pain, stiffness or swelling Skin: Denies  skin rash, easy bruising or bleeding or hives Endoc:  Denies polyuria, polydipsia , polyphagia or weight change Psych:   Denies depression, insomnia or hallucinations  Other:  All other systems negative     Assessment and Plan:   Mild intermittent asthma Well-controlled at this time with Symbicort 160 puffs twice daily She had been asked to switch to Advair due to insurance company Patient advised to rinse mouth after every use Recommended to avoid allergens  Chronic allergic rhinitis Continue cetirizine and montelukast daily  Instructions to patient-  albuterol as needed if you can't catch your breath by resting or doing a relaxed purse lip breathing - Ok to use up to 2 puffs  every 4 hours if you must but call for immediate appointment if use  goes up over your usual need - Don't leave home without it !!     Note your daily symptoms > remember "red flags" for COPD:   >>>Increase in cough >>>increase in sputum production >>>increase in shortness of breath or activity  intolerance.     If you notice these symptoms, please call the office for assessment    COVID-19 EDUCATION: The signs and symptoms of COVID-19 were discussed with the patient and how to seek care for testing.  The importance of social distancing was discussed today. Hand Washing Techniques and avoid touching face was advised.     MEDICATION ADJUSTMENTS/LABS AND TESTS ORDERED: Continue Advair as prescribed Avoid allergens  CURRENT MEDICATIONS REVIEWED AT LENGTH WITH PATIENT TODAY   Patient satisfied with Plan of action and management. All questions answered  Follow-up 6 months   TOTAL TIME SPENT 23 MINS  Zilphia Kozinski Patricia Pesa, M.D.  [facility]  Medical Director [place] Director [facility] Cardio-Pulmonary Department

## 2020-03-23 NOTE — Patient Instructions (Signed)
Continue Symbicort as prescribed Continue albuterol as needed Avoid allergens Avoid secondhand smoke

## 2020-06-09 ENCOUNTER — Other Ambulatory Visit: Payer: Self-pay | Admitting: Podiatry

## 2020-06-09 ENCOUNTER — Ambulatory Visit (INDEPENDENT_AMBULATORY_CARE_PROVIDER_SITE_OTHER): Payer: Managed Care, Other (non HMO) | Admitting: Podiatry

## 2020-06-09 ENCOUNTER — Ambulatory Visit (INDEPENDENT_AMBULATORY_CARE_PROVIDER_SITE_OTHER): Payer: Managed Care, Other (non HMO)

## 2020-06-09 ENCOUNTER — Encounter: Payer: Self-pay | Admitting: Podiatry

## 2020-06-09 ENCOUNTER — Other Ambulatory Visit: Payer: Self-pay

## 2020-06-09 DIAGNOSIS — M7752 Other enthesopathy of left foot: Secondary | ICD-10-CM

## 2020-06-09 DIAGNOSIS — S99922A Unspecified injury of left foot, initial encounter: Secondary | ICD-10-CM

## 2020-06-09 MED ORDER — MELOXICAM 15 MG PO TABS: 15.0000 mg | ORAL_TABLET | Freq: Every day | ORAL | 1 refills | Status: DC

## 2020-06-09 NOTE — Progress Notes (Signed)
   HPI: 39 y.o. female presenting today as a reestablish new patient for evaluation of left great toe pain.  Patient has a history of cracking and popping to the toe joint for the past 6 months - 1 year.  Recently she has had 2 injuries to the exact same area of the left great toe.  She dropped a pullout bed on her toe while on vacation and also dropped the base of a blender on top of her toe as well in the exact same location.  She has significant pain and tenderness to the left great toe joint.  She has been applying Voltaren gel and icing it with Epsom salt soaks.  She presents for further treatment evaluation  Past Medical History:  Diagnosis Date  . Allergic rhinitis   . Asthma   . GERD (gastroesophageal reflux disease)   . Headache   . Hypertension   . Irregular periods/menstrual cycles      Physical Exam: General: The patient is alert and oriented x3 in no acute distress.  Dermatology: Skin is warm, dry and supple bilateral lower extremities. Negative for open lesions or macerations.  Vascular: Palpable pedal pulses bilaterally.  There is some mild edema with ecchymosis noted to the first MTPJ left foot. Capillary refill within normal limits.  Neurological: Epicritic and protective threshold grossly intact bilaterally.   Musculoskeletal Exam: Range of motion within normal limits to all pedal and ankle joints bilateral. Muscle strength 5/5 in all groups bilateral.  Pain on palpation and range of motion noted to the first MTPJ of the left foot  Radiographic Exam:  Normal osseous mineralization. Joint spaces preserved. No fracture/dislocation/boney destruction.    Assessment: 1.  First MTPJ capsulitis left 2.  Injury left great toe, initial encounter   Plan of Care:  1. Patient evaluated. X-Rays reviewed which were negative for fracture.  2.  Injection of 0.5 cc Celestone Soluspan injected in the first MTPJ left foot 3.  Prescription for meloxicam 15 mg daily 4.  Cam boot  dispensed.  Weightbearing as tolerated x3-4 weeks 5.  Return to clinic in 4 weeks  *Patient does not work.  She is a caregiver for her mother      Felecia Shelling, DPM [facility]  Dr. Felecia Shelling, DPM    [address] [place], [place] [address]                Office [phone]  Fax [phone]

## 2020-07-07 ENCOUNTER — Ambulatory Visit (INDEPENDENT_AMBULATORY_CARE_PROVIDER_SITE_OTHER): Payer: Managed Care, Other (non HMO) | Admitting: Podiatry

## 2020-07-07 ENCOUNTER — Other Ambulatory Visit: Payer: Self-pay

## 2020-07-07 ENCOUNTER — Encounter: Payer: Self-pay | Admitting: Podiatry

## 2020-07-07 DIAGNOSIS — S99922A Unspecified injury of left foot, initial encounter: Secondary | ICD-10-CM

## 2020-07-07 DIAGNOSIS — M7752 Other enthesopathy of left foot: Secondary | ICD-10-CM

## 2020-07-07 NOTE — Progress Notes (Signed)
   HPI: 39 y.o. 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  She dropped a pullout bed on her toe while on vacation and also dropped the base of a blender on top of her toe as well in the exact same location.  Last visit on 06/10/2019 when she received anti-inflammatory injection in a cam boot.  She has also been taking the meloxicam as prescribed.  She states significant improvement is doing very well.  No new complaints at this time  Past Medical History:  Diagnosis Date  . Allergic rhinitis   . Asthma   . GERD (gastroesophageal reflux disease)   . Headache   . Hypertension   . Irregular periods/menstrual cycles      Physical Exam: General: The patient is alert and oriented x3 in no acute distress.  Dermatology: Skin is warm, dry and supple bilateral lower extremities. Negative for open lesions or macerations.  Vascular: Palpable pedal pulses bilaterally.  There is some mild edema with ecchymosis noted to the first MTPJ left foot. Capillary refill within normal limits.  Neurological: Epicritic and protective threshold grossly intact bilaterally.   Musculoskeletal Exam: Range of motion within normal limits to all pedal and ankle joints bilateral. Muscle strength 5/5 in all groups bilateral.  There is some minimal pain on palpation and range of motion noted to the first MTPJ of the left foot  Assessment: 1.  First MTPJ capsulitis left 2.  Injury left great toe, initial encounter   Plan of Care:  1. Patient evaluated. 2.  Overall patient has improved significantly.  Today she can discontinue the cam boot.  Recommend postoperative shoe x3 weeks. 3.  Postoperative shoe dispensed today.  After postoperative shoe recommend good supportive shoes and sneakers 4.  Continue meloxicam as needed 5.  Return to work as needed  *Patient does not work.  She is a caregiver for her mother      Felecia Shelling, DPM [facility]  Dr. Felecia Shelling, DPM    [address] [place], [place] [address]                Office [phone]  Fax [phone]

## 2020-07-17 NOTE — Telephone Encounter (Signed)
Dr. Kasa, please advise. Thanks °

## 2020-07-30 ENCOUNTER — Other Ambulatory Visit: Payer: Self-pay | Admitting: Podiatry

## 2020-09-26 ENCOUNTER — Other Ambulatory Visit: Payer: Self-pay | Admitting: Podiatry

## 2020-12-30 ENCOUNTER — Other Ambulatory Visit: Payer: Self-pay | Admitting: Podiatry

## 2021-01-02 NOTE — Telephone Encounter (Signed)
Please advise 

## 2021-01-04 ENCOUNTER — Telehealth: Payer: Self-pay | Admitting: Internal Medicine

## 2021-01-04 MED ORDER — BUDESONIDE-FORMOTEROL FUMARATE 160-4.5 MCG/ACT IN AERO
2.0000 | INHALATION_SPRAY | Freq: Two times a day (BID) | RESPIRATORY_TRACT | 3 refills | Status: AC
Start: 2021-01-04 — End: ?

## 2021-01-04 NOTE — Telephone Encounter (Signed)
90 day supply of Symbicort 160 has been sent to preferred pharmacy. Left detailed message for patient.  Nothing further needed.

## 2021-02-09 ENCOUNTER — Telehealth: Payer: Self-pay | Admitting: Internal Medicine

## 2021-02-09 NOTE — Telephone Encounter (Signed)
Called and spoke with patient who was trying to get appointment scheduled with Dr. Belia Heman but the dates that he is there do not work for her. Informed patient that we have an office in [place] because she lives in [place]. Patient was interested in switching to the [place] office sine that is where she lives.   Dr. Sherene Sires are you ok with taking this patient? Dr. Belia Heman are you ok with her switching?

## 2021-02-09 NOTE — Telephone Encounter (Signed)
Fine with me

## 2021-02-09 NOTE — Telephone Encounter (Signed)
appt scheduled for [place] with MW and nothing further needed

## 2021-04-03 ENCOUNTER — Ambulatory Visit: Payer: Managed Care, Other (non HMO) | Admitting: Internal Medicine

## 2021-06-26 ENCOUNTER — Ambulatory Visit (INDEPENDENT_AMBULATORY_CARE_PROVIDER_SITE_OTHER): Payer: Managed Care, Other (non HMO) | Admitting: Vascular Surgery

## 2021-06-26 ENCOUNTER — Other Ambulatory Visit: Payer: Self-pay

## 2021-06-26 VITALS — BP 133/87 | HR 77 | Ht 61.0 in | Wt 133.0 lb

## 2021-06-26 DIAGNOSIS — I10 Essential (primary) hypertension: Secondary | ICD-10-CM | POA: Diagnosis not present

## 2021-06-26 DIAGNOSIS — M25841 Other specified joint disorders, right hand: Secondary | ICD-10-CM

## 2021-06-26 NOTE — Assessment & Plan Note (Signed)
blood pressure control important in reducing the progression of atherosclerotic disease. On appropriate oral medications.  

## 2021-06-26 NOTE — Progress Notes (Signed)
Patient ID: Alexandria Sweeney, female   DOB: [birth year], 40 y.o.   MRN: [id number]  Chief Complaint  Patient presents with   New Patient (Initial Visit)    Skin lesion on Rt hand . Lesion looks to be venous . Painful and increasing in size. Referred by Jerl Mina    HPI Alexandria Sweeney is a 40 y.o. female.  I am asked to see the patient by Dr. Burnett Sheng for evaluation of a lesion on her left hand.  There is a firm small lesion about the size of a pea that appears attached to her tendon sheath of the third finger.  There is an overlying vein and there was concern this could be a possible vascular lesion.  It is painful.  It has been present now for several months and has not gone away.  She also had a pain, in her left wrist area last week that was new although there is no palpable mass on that side.  She does have prominent veins in her arms.    Past Medical History:  Diagnosis Date   Allergic rhinitis    Asthma    GERD (gastroesophageal reflux disease)    Headache    Hypertension    Irregular periods/menstrual cycles     Past Surgical History:  Procedure Laterality Date   ESOPHAGOGASTRODUODENOSCOPY (EGD) WITH PROPOFOL N/A 07/24/2015   Procedure: ESOPHAGOGASTRODUODENOSCOPY (EGD) WITH PROPOFOL;  Surgeon: Elnita Maxwell, MD;  Location: [facility] ENDOSCOPY;  Service: Endoscopy;  Laterality: N/A;   NASAL SINUS SURGERY     thumb surgery       Family History  Problem Relation Age of Onset   Heart disease Mother    Allergies Father    Cancer Father    Allergies Sister    Breast cancer Maternal Aunt 2   Breast cancer Paternal Grandmother 89       2nd time :age 72      Social History   Tobacco Use   Smoking status: Never   Smokeless tobacco: Never  Substance Use Topics   Alcohol use: No   Drug use: No     Allergies  Allergen Reactions   Doxycycline     Other reaction(s): Other (See Comments) Elevated blood pressure   Nadolol Rash    Current Outpatient  Medications  Medication Sig Dispense Refill   albuterol (PROAIR HFA) 108 (90 Base) MCG/ACT inhaler Inhale 2 puffs into the lungs every 4 (four) hours as needed for wheezing. 54 g 10   budesonide-formoterol (SYMBICORT) 160-4.5 MCG/ACT inhaler Inhale 2 puffs into the lungs in the morning and at bedtime. 3 each 3   cetirizine (ZYRTEC) 10 MG tablet Take 10 mg by mouth daily.     dicyclomine (BENTYL) 10 MG capsule Take by mouth.     diltiazem (CARDIZEM CD) 120 MG 24 hr capsule Take 120 mg by mouth daily.      diltiazem (TIAZAC) 120 MG 24 hr capsule Take by mouth.     ferrous sulfate 325 (65 FE) MG tablet Take 65 mg by mouth daily with breakfast.     fluticasone (FLONASE) 50 MCG/ACT nasal spray Place 2 sprays into both nostrils daily. 1 mL 10   hydrochlorothiazide (HYDRODIURIL) 25 MG tablet Take 25 mg by mouth daily.     montelukast (SINGULAIR) 10 MG tablet TAKE ONE TABLET BY MOUTH DAILY AT BEDTIME 90 tablet 10   Multiple Vitamin (MULTIVITAMIN) capsule Take 1 capsule by mouth daily.     pantoprazole (  PROTONIX) 40 MG tablet Take 40 mg by mouth daily.     budesonide (PULMICORT) 180 MCG/ACT inhaler Inhale into the lungs.     dicyclomine (BENTYL) 10 MG capsule Take 10 mg by mouth 2 (two) times daily. (Patient not taking: No sig reported)     meloxicam (MOBIC) 15 MG tablet TAKE 1 TABLET BY MOUTH EVERY DAY 30 tablet 1   No current facility-administered medications for this visit.      REVIEW OF SYSTEMS (Negative unless checked)  Constitutional: [] Weight loss  [] Fever  [] Chills Cardiac: [] Chest pain   [] Chest pressure   [] Palpitations   [] Shortness of breath when laying flat   [] Shortness of breath at rest   [] Shortness of breath with exertion. Vascular:  [] Pain in legs with walking   [] Pain in legs at rest   [] Pain in legs when laying flat   [] Claudication   [] Pain in feet when walking  [] Pain in feet at rest  [] Pain in feet when laying flat   [] History of DVT   [] Phlebitis   [] Swelling in legs    [] Varicose veins   [] Non-healing ulcers Pulmonary:   [] Uses home oxygen   [] Productive cough   [] Hemoptysis   [] Wheeze  [] COPD   [x] Asthma Neurologic:  [] Dizziness  [] Blackouts   [] Seizures   [] History of stroke   [] History of TIA  [] Aphasia   [] Temporary blindness   [] Dysphagia   [] Weakness or numbness in arms   [] Weakness or numbness in legs Musculoskeletal:  [] Arthritis   [] Joint swelling   [] Joint pain   [] Low back pain Hematologic:  [] Easy bruising  [] Easy bleeding   [] Hypercoagulable state   [] Anemic  [] Hepatitis Gastrointestinal:  [] Blood in stool   [] Vomiting blood  [x] Gastroesophageal reflux/heartburn   [] Abdominal pain Genitourinary:  [] Chronic kidney disease   [] Difficult urination  [] Frequent urination  [] Burning with urination   [] Hematuria Skin:  [] Rashes   [] Ulcers   [] Wounds Psychological:  [] History of anxiety   []  History of major depression.    Physical Exam BP 133/87   Pulse 77   Ht 5\' 1"  (1.549 m)   Wt 133 lb (60.3 kg)   BMI 25.13 kg/m  Gen:  WD/WN, NAD Head: [place]/AT, No temporalis wasting.  Ear/Nose/Throat: Hearing grossly intact, nares w/o erythema or drainage, oropharynx w/o Erythema/Exudate Eyes: Conjunctiva clear, sclera non-icteric  Neck: trachea midline.  No JVD.  Pulmonary:  Good air movement, respirations not labored, no use of accessory muscles  Cardiac: RRR, no JVD Vascular:  Vessel Right Left  Radial Palpable Palpable                                   Musculoskeletal: M/S 5/5 throughout.  Extremities without ischemic changes.  Roughly pea-sized cyst on the palmar aspect of the left hand just distal to the wrist.  This seems to move with flexion of her hand and appears to be attached to the flexor tendon. Neurologic: Sensation grossly intact in extremities.  Symmetrical.  Speech is fluent. Motor exam as listed above. Psychiatric: Judgment intact, Mood & affect appropriate for pt's clinical situation. Dermatologic: No rashes or ulcers noted.  No  cellulitis or open wounds.    Radiology No results found.  Labs No results found for this or any previous visit (from the past 2160 hour(s)).  Assessment/Plan:  Cyst of joint of hand, right The patient has what appears to be a cyst attached to her  tendon sheath on the right hand.  I discussed that I do not think this is a true vascular anomaly and I do not think that it is associated with the overlying vein.  I am going to refer her to one of her local orthopedic doctors who does handwork for further evaluation.  Essential hypertension blood pressure control important in reducing the progression of atherosclerotic disease. On appropriate oral medications.      Festus Barren 06/26/2021, 1:10 PM   This note was created with Dragon medical transcription system.  Any errors from dictation are unintentional.

## 2021-06-26 NOTE — Assessment & Plan Note (Signed)
The patient has what appears to be a cyst attached to her tendon sheath on the right hand.  I discussed that I do not think this is a true vascular anomaly and I do not think that it is associated with the overlying vein.  I am going to refer her to one of her local orthopedic doctors who does handwork for further evaluation.

## 2021-07-10 ENCOUNTER — Telehealth (INDEPENDENT_AMBULATORY_CARE_PROVIDER_SITE_OTHER): Payer: Self-pay | Admitting: Vascular Surgery

## 2021-07-10 NOTE — Addendum Note (Signed)
Addended by: Annice Needy on: 07/10/2021 03:34 PM   Modules accepted: Orders

## 2021-07-10 NOTE — Telephone Encounter (Signed)
Called to check the status of referral. Patient stated that she was seen 06/26/21 and JD was to put in a referral for Dr. Rosita Kea. Patient has an appt with him on tomorrow 07/11/21 but referral was not sent.   Please send referral to Dr. Rosita Kea

## 2021-11-15 ENCOUNTER — Other Ambulatory Visit: Payer: Self-pay | Admitting: Internal Medicine

## 2021-11-15 DIAGNOSIS — J4520 Mild intermittent asthma, uncomplicated: Secondary | ICD-10-CM

## 2021-11-19 ENCOUNTER — Other Ambulatory Visit: Payer: Self-pay | Admitting: Family Medicine

## 2021-11-19 ENCOUNTER — Other Ambulatory Visit (HOSPITAL_COMMUNITY): Payer: Self-pay | Admitting: Family Medicine

## 2021-11-19 DIAGNOSIS — R590 Localized enlarged lymph nodes: Secondary | ICD-10-CM

## 2021-11-21 ENCOUNTER — Other Ambulatory Visit: Payer: Self-pay

## 2021-11-21 ENCOUNTER — Ambulatory Visit
Admission: RE | Admit: 2021-11-21 | Discharge: 2021-11-21 | Disposition: A | Discharge status: Home / Self Care | Payer: Managed Care, Other (non HMO) | Source: Ambulatory Visit | Attending: Family Medicine | Admitting: Family Medicine

## 2021-11-21 DIAGNOSIS — R590 Localized enlarged lymph nodes: Secondary | ICD-10-CM | POA: Diagnosis present

## 2021-12-04 ENCOUNTER — Other Ambulatory Visit: Payer: Self-pay | Admitting: Family Medicine

## 2021-12-04 DIAGNOSIS — R590 Localized enlarged lymph nodes: Secondary | ICD-10-CM

## 2021-12-20 ENCOUNTER — Ambulatory Visit
Admission: RE | Admit: 2021-12-20 | Discharge: 2021-12-20 | Disposition: A | Discharge status: Home / Self Care | Payer: Managed Care, Other (non HMO) | Source: Ambulatory Visit | Attending: Family Medicine | Admitting: Family Medicine

## 2021-12-20 ENCOUNTER — Other Ambulatory Visit: Payer: Self-pay

## 2021-12-20 DIAGNOSIS — R590 Localized enlarged lymph nodes: Secondary | ICD-10-CM | POA: Diagnosis not present

## 2021-12-20 LAB — POCT I-STAT CREATININE: Creatinine, Ser: 0.6 mg/dL (ref 0.44–1.00)

## 2021-12-20 MED ORDER — IOHEXOL 350 MG/ML SOLN
75.0000 mL | Freq: Once | INTRAVENOUS | Status: AC | PRN
  Administered 2021-12-20: 75 mL via INTRAVENOUS

## 2022-02-14 ENCOUNTER — Telehealth: Payer: Self-pay | Admitting: Internal Medicine

## 2022-02-14 NOTE — Telephone Encounter (Signed)
Called and spoke to patient let her know that we have not received any record request from her  insurance. Nothing further needed. ?

## 2022-06-08 IMAGING — US US SOFT TISSUE HEAD/NECK
1 series · 7 of 7 positions shown · non-contrast
Comparison: None

CLINICAL DATA: Provided history: Cervical lymphadenopathy.
Additional history provided: Patient reports right-sided neck nodule

EXAM:
ULTRASOUND OF HEAD/NECK SOFT TISSUES
TECHNIQUE: Ultrasound examination of the head/neck soft tissues was performed
in the area of clinical concern.

[Series 1: us soft tissue head/neck · 0.06mm/px · 7 of 7 slices shown]
[im 1/7]
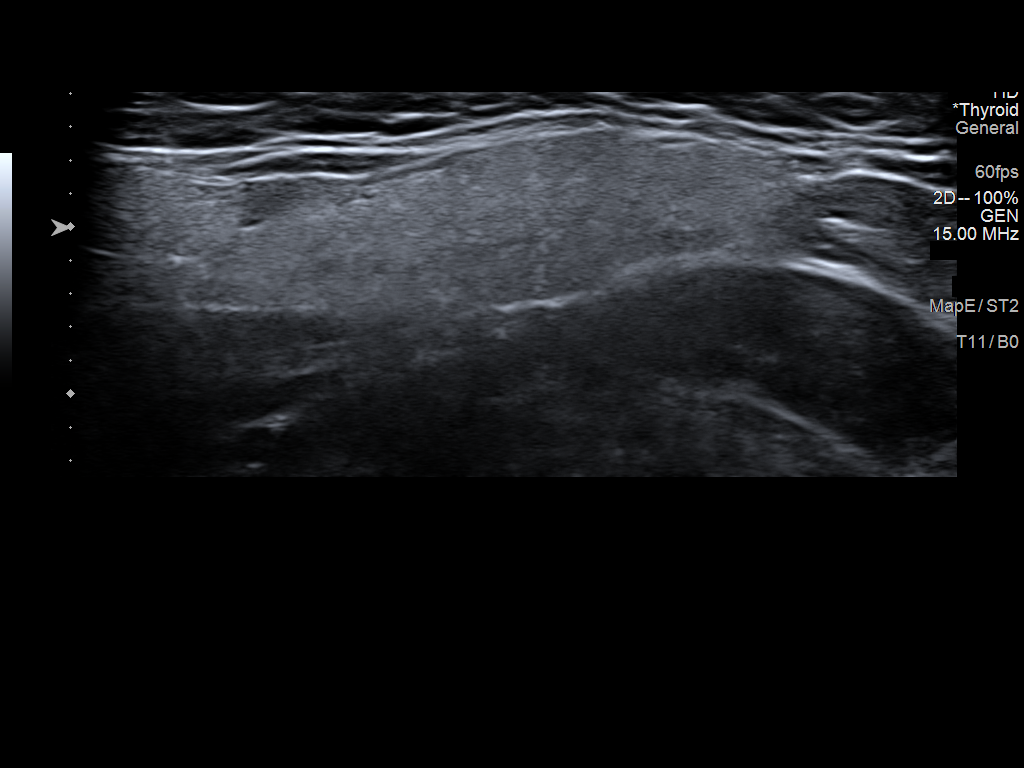
[im 2/7]
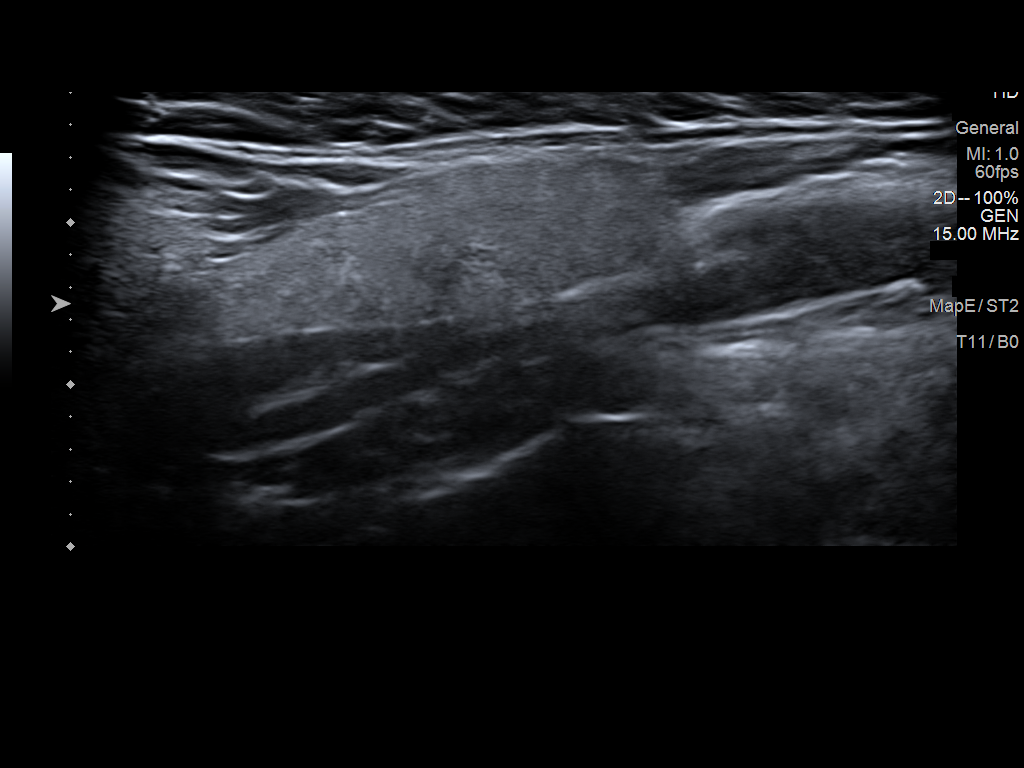
[im 3/7]
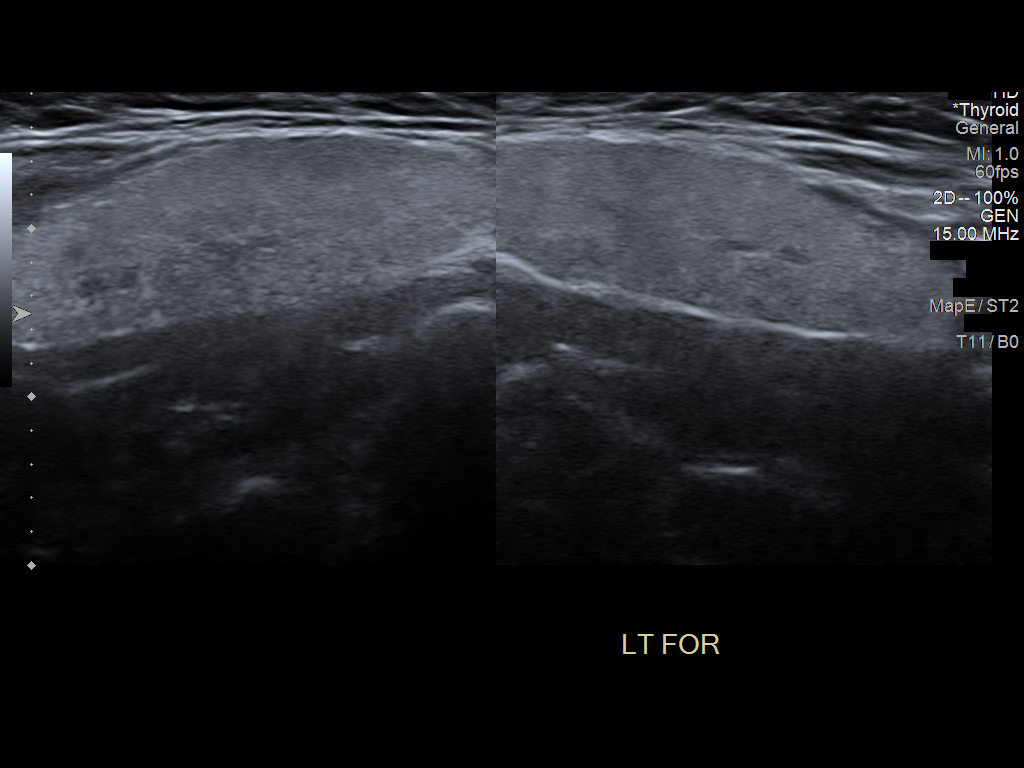
[im 4/7]
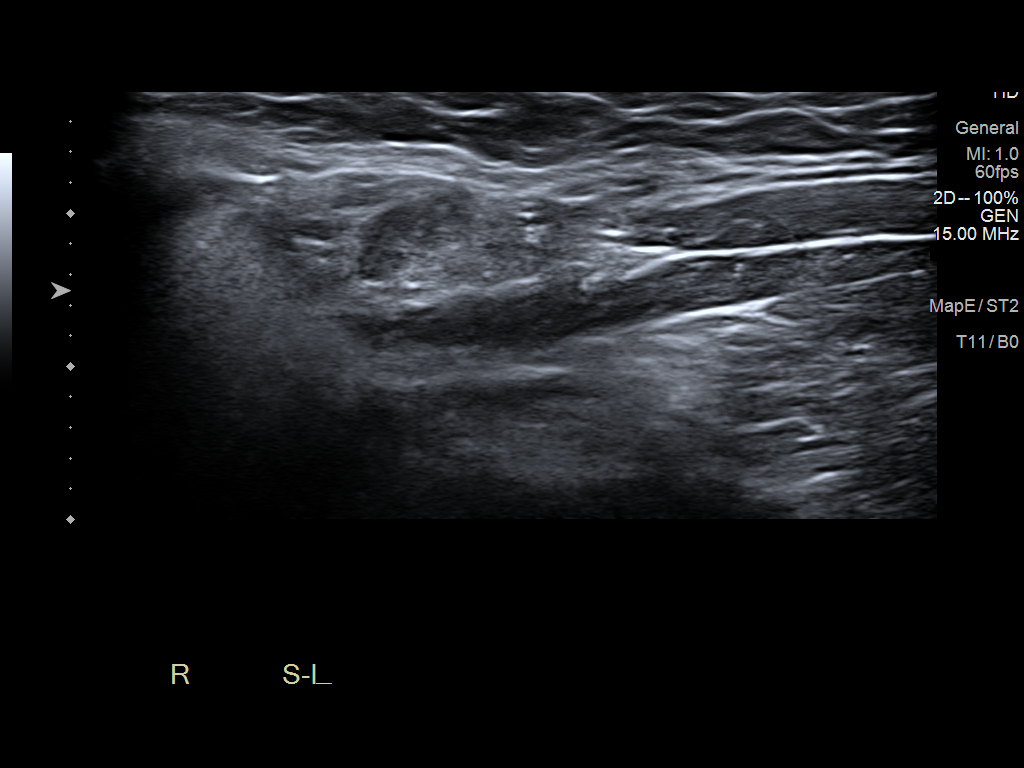
[im 5/7]
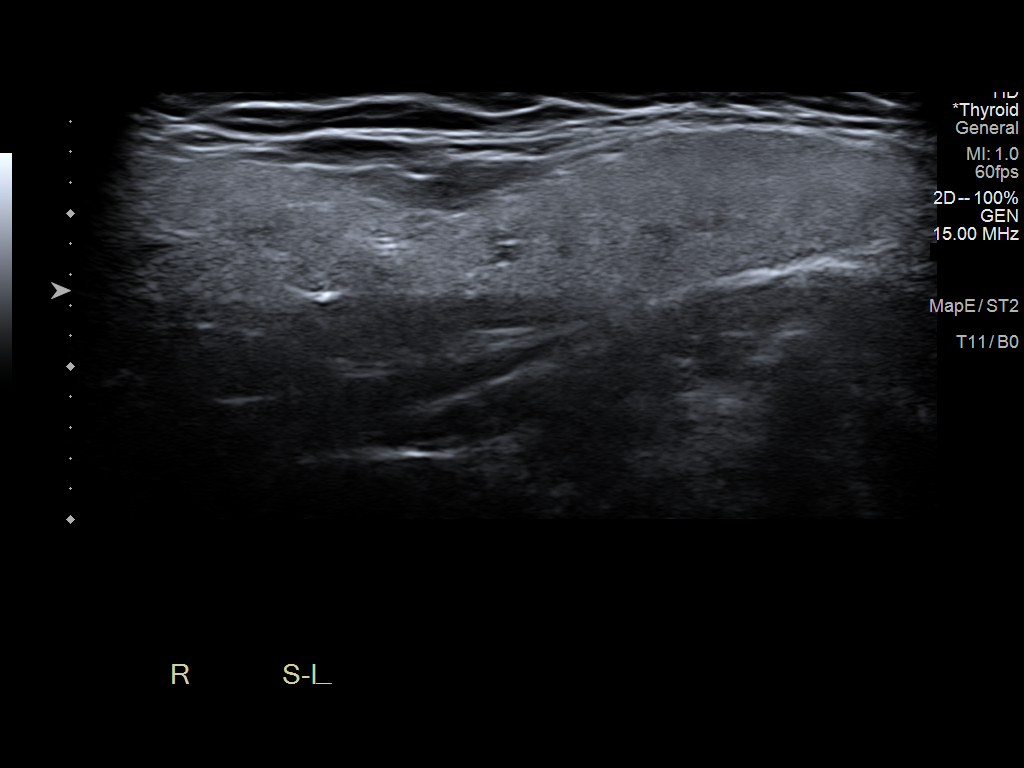
[im 6/7]
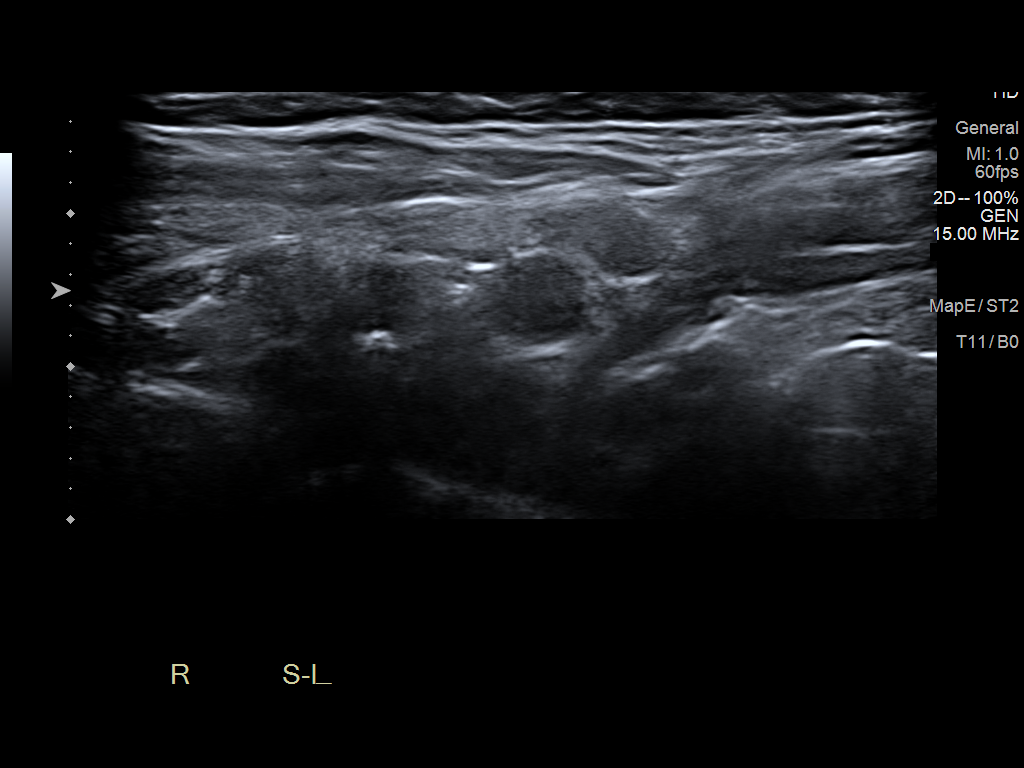
[im 7/7]
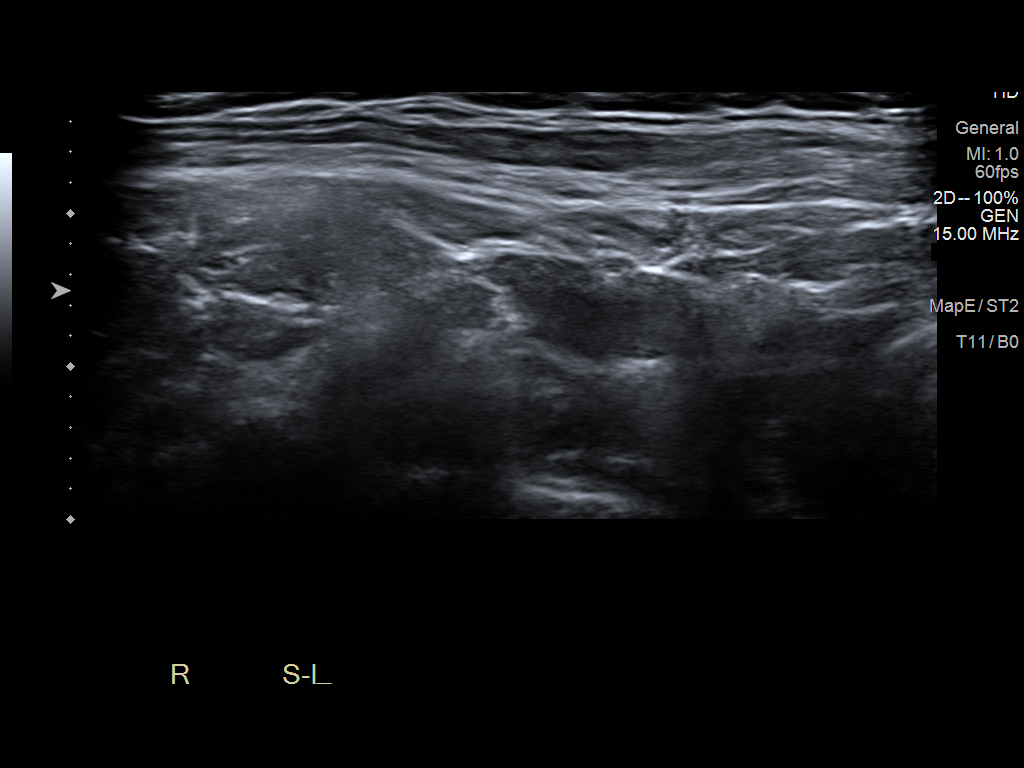

[7 of 7 positions shown; findings below may reference images not displayed]

FINDINGS: Targeted ultrasound was performed at the right upper neck region of
concern. Limited imaging of the left upper neck was also performed
for the purposes of comparison. No mass or pathologically enlarged
lymph node is identified in the region of concern. Unremarkable
appearance of the visualized right submandibular gland.
IMPRESSION: No abnormality is identified within the right upper neck region of
concern. A contrast enhanced neck CT may be obtained for further
evaluation, as clinically warranted.

## 2022-07-07 IMAGING — CT CT NECK W/ CM
5 series · 14 of 35 positions shown, 16 images · IV contrast (agent unspecified)
Comparison: Ultrasound of the head/neck soft tissues 11/21/2021.

CLINICAL DATA: Cervical lymphadenopathy. Additional history
provided: Patient reports a "knot" in anterior neck on right side.

EXAM:
CT NECK WITH CONTRAST
TECHNIQUE: Multidetector CT imaging of the neck was performed using the
standard protocol following the bolus administration of intravenous
contrast.

[Series 2: axial neck neck (person_name) 2.00 · axial · 0.55mm/px · z∈[-631,-557]mm · 2 of 111 slices shown]
[im 37/111  bone]
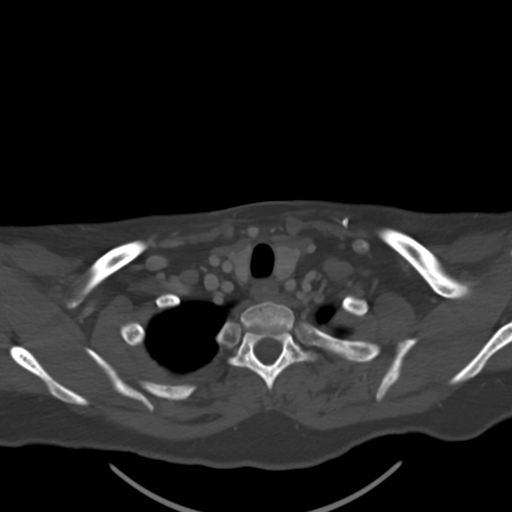
[im 74/111  bone]
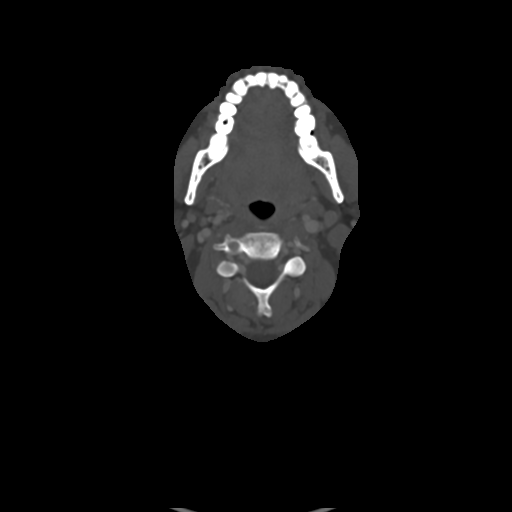

[Series 3: axial bone neck 2.00 · axial · 0.55mm/px · z∈[-649,-539]mm · 3 of 111 slices shown, 4 images]
[im 28/111  soft-tissue]
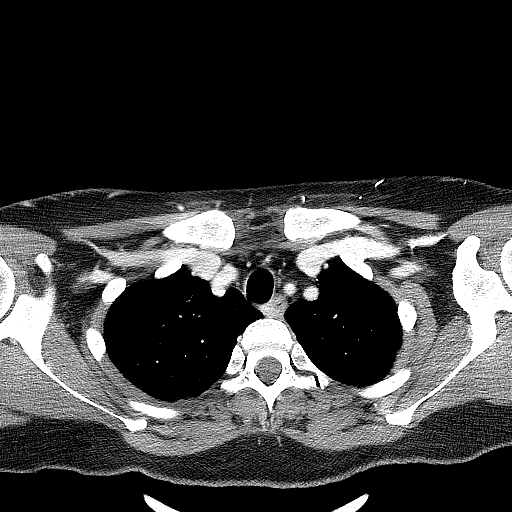
[im 28/111  bone]
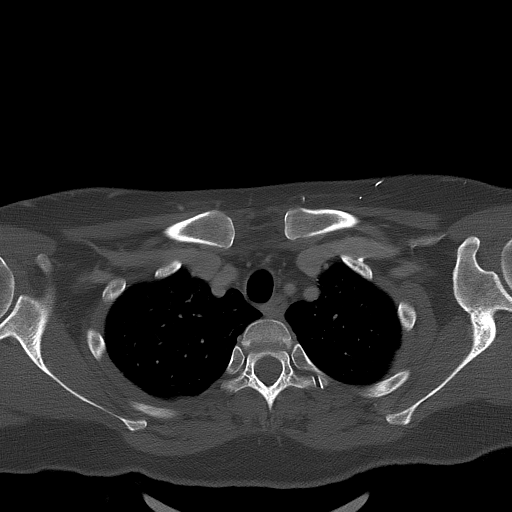
[im 56/111  bone]
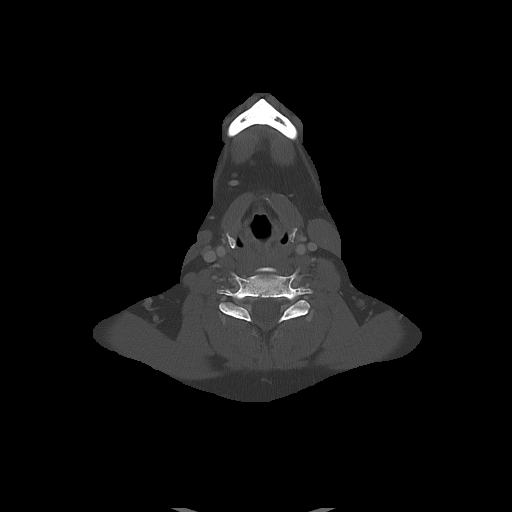
[im 83/111  bone]
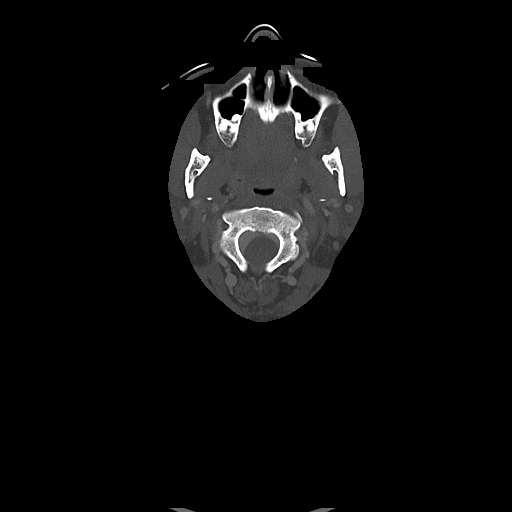

[Series 4: coronal neck neck (person_name) 2.00 cor · coronal · 0.44mm/px · 3 of 138 slices shown]
[im 28/138  bone]
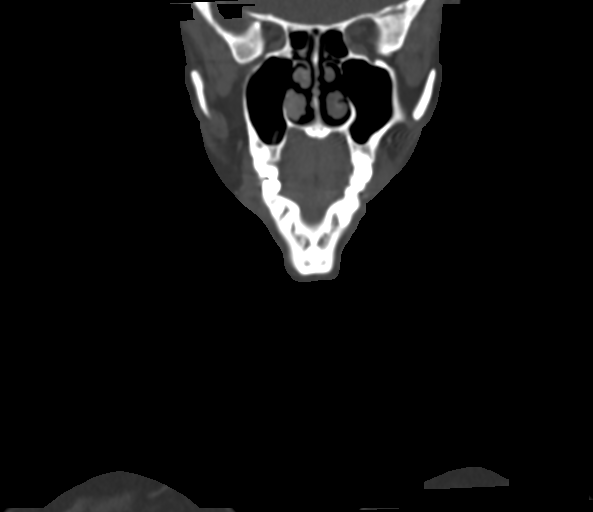
[im 55/138  bone]
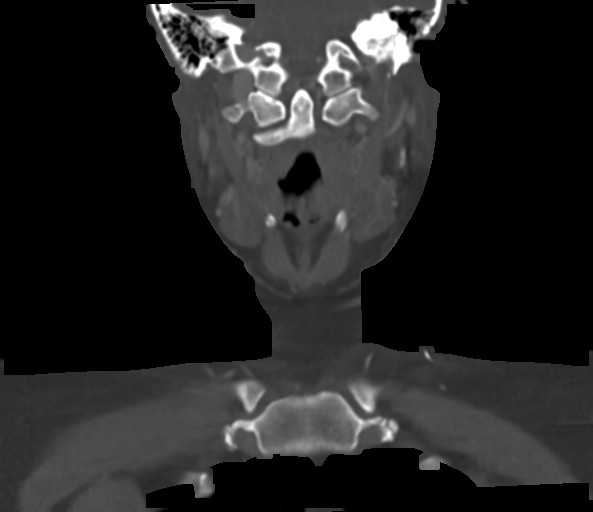
[im 83/138  bone]
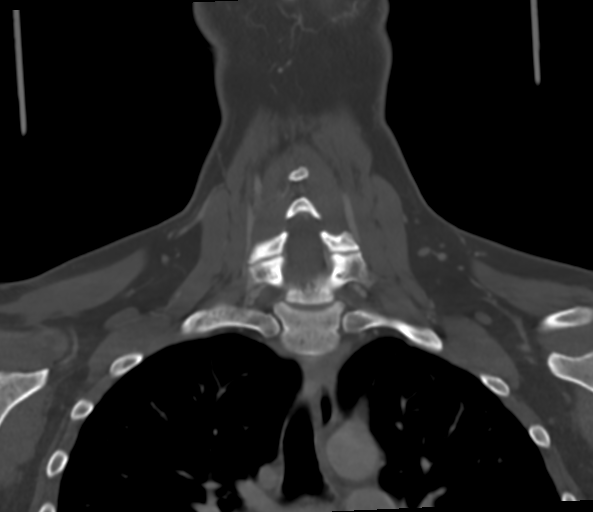

[Series 6: sagittal neck neck (person_name) 2.00 sag · sagittal · 0.44mm/px · 5 of 129 slices shown, 6 images]
[im 43/129  bone]
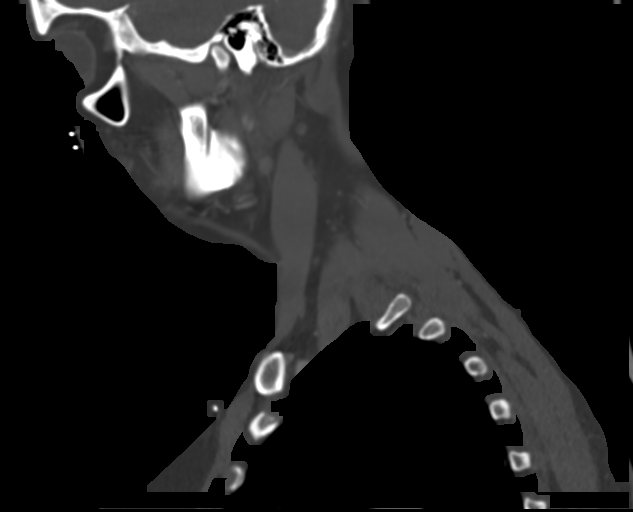
[im 54/129  bone]
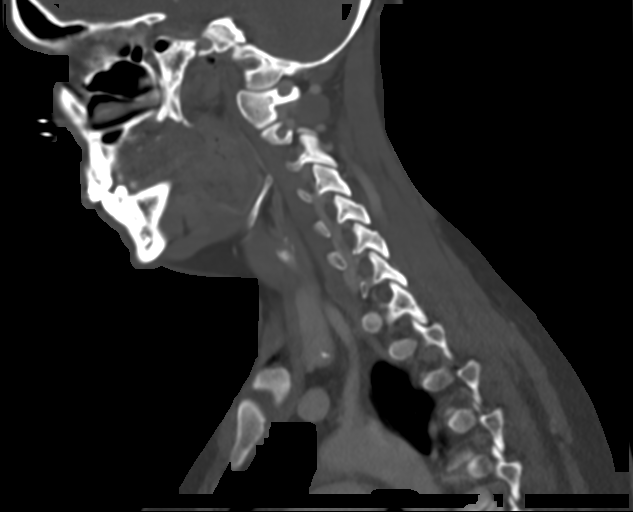
[im 65/129  soft-tissue]
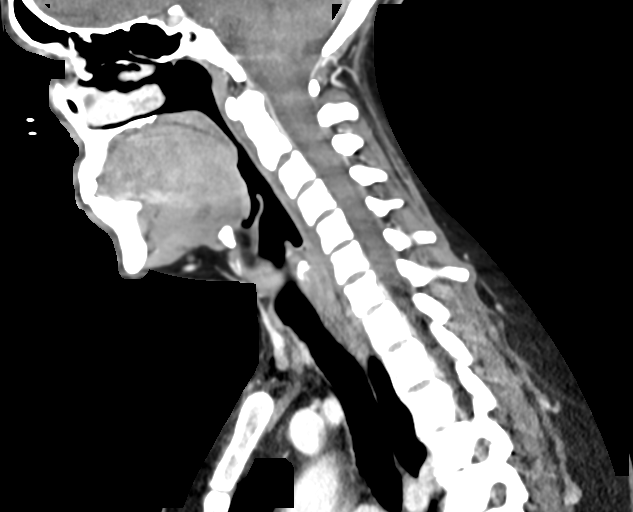
[im 65/129  bone]
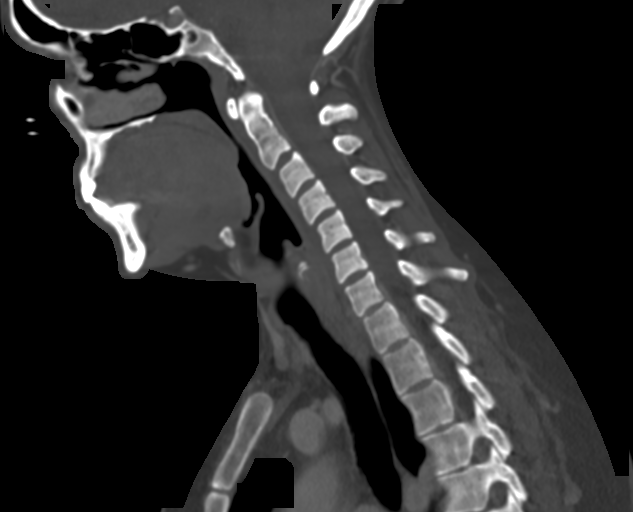
[im 75/129  bone]
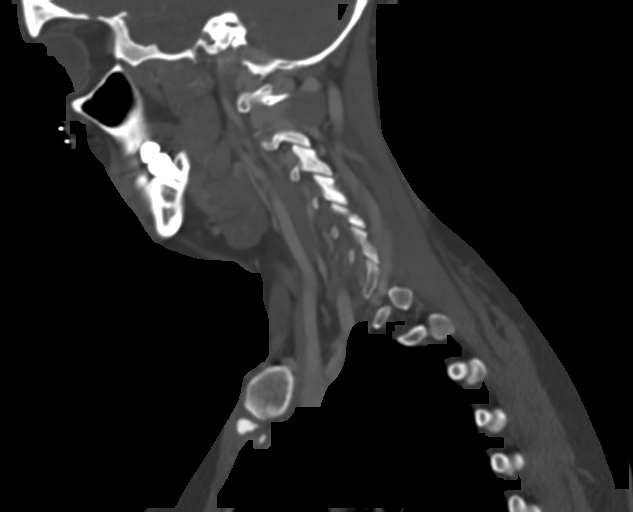
[im 86/129  bone]
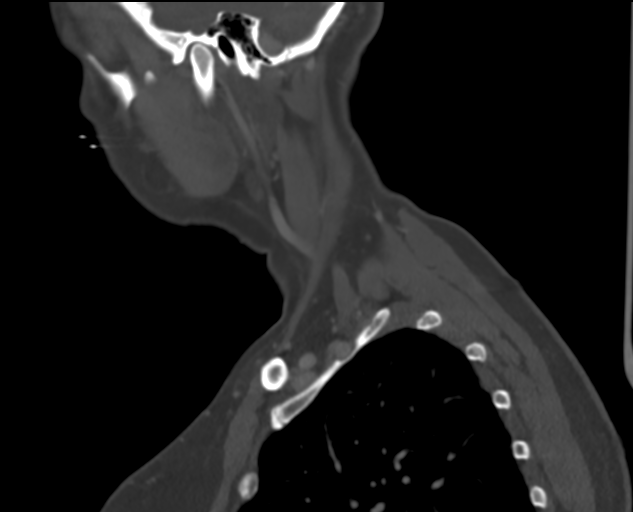

[Series 8: ax oropharynx neck neck (person_name) 2.00 ax · axial · 0.51mm/px · 1 of 111 slices shown]
[im 28/111  bone]
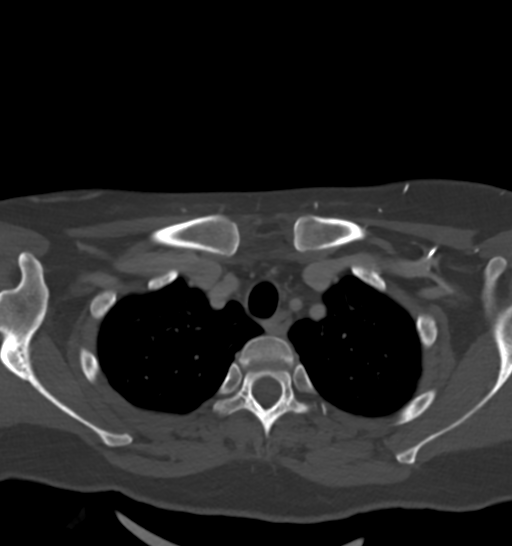

[14 of 35 positions shown; findings below may reference images not displayed]

RADIATION DOSE REDUCTION: This exam was performed according to the
departmental dose-optimization program which includes automated
exposure control, adjustment of the mA and/or kV according to
patient size and/or use of iterative reconstruction technique.

CONTRAST:  75mL OMNIPAQUE IOHEXOL 350 MG/ML SOLN
FINDINGS: Pharynx and larynx: No appreciable swelling or discrete mass within
the oral cavity, pharynx or larynx.

Salivary glands: No inflammation, mass, or stone.

Thyroid: 10 mm nodule within the left thyroid lobe, not meeting
consensus criteria for ultrasound follow-up based on size.

Lymph nodes: No pathologically enlarged cervical chain lymph nodes
are identified.

Vascular: The major vascular structures of the neck are patent.

Limited intracranial: No evidence of acute intracranial abnormality
within the field of view.

Visualized orbits: No orbital mass or acute orbital finding.

Mastoids and visualized paranasal sinuses: Postsurgical appearance
of the paranasal sinuses. The frontal sinuses are incompletely
included in the field of view. Trace mucosal thickening within the
ethmoidectomy cavities on the right. No significant mastoid
effusion.

Skeleton: Mild reversal of the expected cervical lordosis. No
significant spondylolisthesis.

Upper chest: No consolidation within the imaged lung apices.

Other: The right-sided skin surface marker overlies the right
submandibular gland. As noted above, the right submandibular gland
is unremarkable in appearance. No adjacent abnormality is
appreciated.
IMPRESSION: No neck mass or pathologically enlarged lymph nodes identified.

The right-sided skin surface marker overlies the right submandibular
gland, which is unremarkable in appearance.

Postsurgical changes to the paranasal sinuses. Trace mucosal
thickening within the ethmoidectomy cavities on the right.

## 2022-09-30 ENCOUNTER — Encounter (INDEPENDENT_AMBULATORY_CARE_PROVIDER_SITE_OTHER): Payer: Self-pay

## 2023-06-26 ENCOUNTER — Ambulatory Visit (INDEPENDENT_AMBULATORY_CARE_PROVIDER_SITE_OTHER): Payer: Managed Care, Other (non HMO) | Admitting: Podiatry

## 2023-06-26 ENCOUNTER — Ambulatory Visit (INDEPENDENT_AMBULATORY_CARE_PROVIDER_SITE_OTHER): Payer: Managed Care, Other (non HMO)

## 2023-06-26 DIAGNOSIS — S99921A Unspecified injury of right foot, initial encounter: Secondary | ICD-10-CM

## 2023-06-26 MED ORDER — MELOXICAM 15 MG PO TABS: 15.0000 mg | ORAL_TABLET | Freq: Every day | ORAL | 0 refills | Status: AC

## 2023-06-26 MED ORDER — METHYLPREDNISOLONE 4 MG PO TBPK: ORAL_TABLET | ORAL | 0 refills | Status: AC

## 2023-06-26 NOTE — Addendum Note (Signed)
Addended by: Nicholes Rough on: 06/26/2023 03:00 PM   Modules accepted: Orders

## 2023-06-26 NOTE — Progress Notes (Signed)
Subjective:  Patient ID: Alexandria Sweeney, female    DOB: 16-May-1981,  MRN: [id number]  Chief Complaint  Patient presents with   Foot Injury    injured right heel going up back of heel just a little    42 y.o. female presents with the above complaint.  Patient presents with right plantar fascial tear/acute microtear of the plantar fascia.  She states she was walking her dog and may have caught herself off guard and resulted in pain right at the midfoot/plantar fascial band.  She denies any other acute complaints.  Pain scale 7 out of 10 dull achy in nature hurts with ambulation worse with pressure she only has a boot at home from previous injury   Review of Systems: Negative except as noted in the HPI. Denies N/V/F/Ch.  Past Medical History:  Diagnosis Date   Allergic rhinitis    Asthma    GERD (gastroesophageal reflux disease)    Headache    Hypertension    Irregular periods/menstrual cycles     Current Outpatient Medications:    albuterol (PROAIR HFA) 108 (90 Base) MCG/ACT inhaler, Inhale 2 puffs into the lungs every 4 (four) hours as needed for wheezing., Disp: 54 g, Rfl: 10   budesonide-formoterol (SYMBICORT) 160-4.5 MCG/ACT inhaler, Inhale 2 puffs into the lungs in the morning and at bedtime., Disp: 3 each, Rfl: 3   cetirizine (ZYRTEC) 10 MG tablet, Take 10 mg by mouth daily., Disp: , Rfl:    dicyclomine (BENTYL) 10 MG capsule, Take 10 mg by mouth 2 (two) times daily., Disp: , Rfl:    dicyclomine (BENTYL) 10 MG capsule, Take by mouth., Disp: , Rfl:    diltiazem (CARDIZEM CD) 120 MG 24 hr capsule, Take 120 mg by mouth daily. , Disp: , Rfl:    diltiazem (TIAZAC) 120 MG 24 hr capsule, Take by mouth., Disp: , Rfl:    ferrous sulfate 325 (65 FE) MG tablet, Take 65 mg by mouth daily with breakfast., Disp: , Rfl:    fluticasone (FLONASE) 50 MCG/ACT nasal spray, Place 2 sprays into both nostrils daily., Disp: 1 mL, Rfl: 10   hydrochlorothiazide (HYDRODIURIL) 25 MG tablet, Take 25  mg by mouth daily., Disp: , Rfl:    montelukast (SINGULAIR) 10 MG tablet, TAKE ONE TABLET BY MOUTH DAILY AT BEDTIME, Disp: 90 tablet, Rfl: 10   Multiple Vitamin (MULTIVITAMIN) capsule, Take 1 capsule by mouth daily., Disp: , Rfl:    pantoprazole (PROTONIX) 40 MG tablet, Take 40 mg by mouth daily., Disp: , Rfl:    budesonide (PULMICORT) 180 MCG/ACT inhaler, Inhale into the lungs., Disp: , Rfl:    meloxicam (MOBIC) 15 MG tablet, TAKE 1 TABLET BY MOUTH EVERY DAY, Disp: 30 tablet, Rfl: 1  Social History   Tobacco Use  Smoking Status Never  Smokeless Tobacco Never    Allergies  Allergen Reactions   Doxycycline     Other reaction(s): Other (See Comments) Elevated blood pressure   Nadolol Rash   Objective:  There were no vitals filed for this visit. There is no height or weight on file to calculate BMI. Constitutional Well developed. Well nourished.  Vascular Dorsalis pedis pulses palpable bilaterally. Posterior tibial pulses palpable bilaterally. Capillary refill normal to all digits.  No cyanosis or clubbing noted. Pedal hair growth normal.  Neurologic Normal speech. Oriented to person, place, and time. Epicritic sensation to light touch grossly present bilaterally.  Dermatologic Nails well groomed and normal in appearance. No open wounds. No skin lesions.  Orthopedic: Pain on palpation along the course of the plantar fascia.  Pain at the calcaneal tubercle.  Plantar fascia is intact with dorsiflexion of the hallux.  hUbshur maneuver is intact   Radiographs: 3 views of skeletally mature adult right heel: No fractures notedMild midfoot arthritis noted no other bony abnormalities identified Assessment:   1. Foot injury, right, initial encounter    Plan:  Patient was evaluated and treated and all questions answered.  Right plantar fascial acute rupture/microtrauma -All questions and concerns were discussed with the patient extensive detail given the amount of pain that she is  having she will benefit from cam boot immobilization.  She already has cam boot at home advised her staff to place her in it. -I discussed shoe gear modification as well.  No follow-ups on file.

## 2023-07-01 ENCOUNTER — Other Ambulatory Visit: Payer: Self-pay | Admitting: Podiatry

## 2023-07-24 ENCOUNTER — Ambulatory Visit: Payer: Managed Care, Other (non HMO) | Admitting: Podiatry

## 2023-07-24 DIAGNOSIS — Q666 Other congenital valgus deformities of feet: Secondary | ICD-10-CM

## 2023-07-24 DIAGNOSIS — M722 Plantar fascial fibromatosis: Secondary | ICD-10-CM | POA: Diagnosis not present

## 2023-07-24 NOTE — Progress Notes (Signed)
Subjective:  Patient ID: Alexandria Sweeney, female    DOB: 30-Sep-1981,  MRN: [id number]  Chief Complaint  Patient presents with   Foot Injury    42 y.o. female presents with the above complaint.  Patient Alexandria Sweeney for follow-up of right plantar fasciitis/plantar fascial tear.  She states the boot helped a lot.  She would like to discuss next treatment plan including injection.  She does not wear orthotics.   Review of Systems: Negative except as noted in the HPI. Denies N/V/F/Ch.  Past Medical History:  Diagnosis Date   Allergic rhinitis    Asthma    GERD (gastroesophageal reflux disease)    Headache    Hypertension    Irregular periods/menstrual cycles     Current Outpatient Medications:    albuterol (PROAIR HFA) 108 (90 Base) MCG/ACT inhaler, Inhale 2 puffs into the lungs every 4 (four) hours as needed for wheezing., Disp: 54 g, Rfl: 10   budesonide (PULMICORT) 180 MCG/ACT inhaler, Inhale into the lungs., Disp: , Rfl:    budesonide-formoterol (SYMBICORT) 160-4.5 MCG/ACT inhaler, Inhale 2 puffs into the lungs in the morning and at bedtime., Disp: 3 each, Rfl: 3   cetirizine (ZYRTEC) 10 MG tablet, Take 10 mg by mouth daily., Disp: , Rfl:    dicyclomine (BENTYL) 10 MG capsule, Take 10 mg by mouth 2 (two) times daily., Disp: , Rfl:    dicyclomine (BENTYL) 10 MG capsule, Take by mouth., Disp: , Rfl:    diltiazem (CARDIZEM CD) 120 MG 24 hr capsule, Take 120 mg by mouth daily. , Disp: , Rfl:    diltiazem (TIAZAC) 120 MG 24 hr capsule, Take by mouth., Disp: , Rfl:    ferrous sulfate 325 (65 FE) MG tablet, Take 65 mg by mouth daily with breakfast., Disp: , Rfl:    fluticasone (FLONASE) 50 MCG/ACT nasal spray, Place 2 sprays into both nostrils daily., Disp: 1 mL, Rfl: 10   hydrochlorothiazide (HYDRODIURIL) 25 MG tablet, Take 25 mg by mouth daily., Disp: , Rfl:    meloxicam (MOBIC) 15 MG tablet, TAKE 1 TABLET BY MOUTH EVERY DAY, Disp: 30 tablet, Rfl: 1   meloxicam (MOBIC) 15 MG tablet, Take  1 tablet (15 mg total) by mouth daily., Disp: 30 tablet, Rfl: 0   methylPREDNISolone (MEDROL DOSEPAK) 4 MG TBPK tablet, Take as directed, Disp: 21 each, Rfl: 0   montelukast (SINGULAIR) 10 MG tablet, TAKE ONE TABLET BY MOUTH DAILY AT BEDTIME, Disp: 90 tablet, Rfl: 10   Multiple Vitamin (MULTIVITAMIN) capsule, Take 1 capsule by mouth daily., Disp: , Rfl:    pantoprazole (PROTONIX) 40 MG tablet, Take 40 mg by mouth daily., Disp: , Rfl:   Social History   Tobacco Use  Smoking Status Never  Smokeless Tobacco Never    Allergies  Allergen Reactions   Doxycycline     Other reaction(s): Other (See Comments) Elevated blood pressure   Nadolol Rash   Objective:  There were no vitals filed for this visit. There is no height or weight on file to calculate BMI. Constitutional Well developed. Well nourished.  Vascular Dorsalis pedis pulses palpable bilaterally. Posterior tibial pulses palpable bilaterally. Capillary refill normal to all digits.  No cyanosis or clubbing noted. Pedal hair growth normal.  Neurologic Normal speech. Oriented to person, place, and time. Epicritic sensation to light touch grossly present bilaterally.  Dermatologic Nails well groomed and normal in appearance. No open wounds. No skin lesions.  Orthopedic: Pain on palpation along the course of the plantar fascia.  Pain at the calcaneal tubercle.  Plantar fascia is intact with dorsiflexion of the hallux.  hUbshur maneuver is intact   Radiographs: 3 views of skeletally mature adult right heel: No fractures notedMild midfoot arthritis noted no other bony abnormalities identified Assessment:   No diagnosis found.  Plan:  Patient was evaluated and treated and all questions answered.  Right plantar fascial acute rupture/microtrauma -Clinically her pain improved considerably with the boot she still has some residual pain I believe she would benefit from a steroid injection I discussed this with her she states  understanding to proceed with steroid injection A steroid injection was performed at right medial calcaneal tuber using 1% plain Lidocaine and 10 mg of Kenalog. This was well tolerated.  Pes planovalgus -I explained to patient the etiology of pes planovalgus and relationship with Planter fasciitis and various treatment options were discussed.  Given patient foot structure in the setting of Planter fasciitis I believe patient will benefit from custom-made orthotics to help control the hindfoot motion support the arch of the foot and take the stress away from plantar fascial.  Patient agrees with the plan like to proceed with orthotics -Patient was casted for orthotics    No follow-ups on file.

## 2023-08-08 NOTE — Progress Notes (Signed)
Orthotic order placed.

## 2023-08-22 ENCOUNTER — Telehealth: Payer: Self-pay

## 2023-08-22 NOTE — Telephone Encounter (Signed)
Patient called and would like to know the status of her orthotics.

## 2023-09-12 NOTE — Telephone Encounter (Signed)
Called the pt and lmom for her to call the office to schedule an appointment to pick up her orthotics.

## 2023-09-19 ENCOUNTER — Ambulatory Visit: Payer: Managed Care, Other (non HMO)

## 2023-09-19 NOTE — Progress Notes (Signed)
Patient presents today to pick up custom molded foot orthotics, diagnosed with PF by Dr. Allena Katz.   Orthotics were dispensed and fit was satisfactory. Reviewed instructions for break-in and wear. Written instructions given to patient.  Patient will follow up as needed.   Addison Bailey Cped, CFo, CFm

## 2023-12-31 ENCOUNTER — Telehealth: Payer: Self-pay | Admitting: Podiatry

## 2023-12-31 NOTE — Telephone Encounter (Signed)
Pt called and asked to speak to me and I was on the other line so she gave her number for me to call back.  I returned call and she is asking if someone could call her back to discuss the statement of all charges from 2024. She got the breakdown but needs assistance to understand it.She is not sure what insurance paid and what she paid.

## 2024-05-13 ENCOUNTER — Other Ambulatory Visit: Payer: Self-pay | Admitting: Family Medicine

## 2024-05-13 DIAGNOSIS — Z1231 Encounter for screening mammogram for malignant neoplasm of breast: Secondary | ICD-10-CM

## 2024-05-28 ENCOUNTER — Ambulatory Visit
Admission: RE | Admit: 2024-05-28 | Discharge: 2024-05-28 | Disposition: A | Discharge status: Home / Self Care | Source: Ambulatory Visit | Attending: Family Medicine | Admitting: Family Medicine

## 2024-05-28 DIAGNOSIS — Z1231 Encounter for screening mammogram for malignant neoplasm of breast: Secondary | ICD-10-CM | POA: Diagnosis present

## 2024-07-14 ENCOUNTER — Ambulatory Visit (INDEPENDENT_AMBULATORY_CARE_PROVIDER_SITE_OTHER): Payer: Self-pay

## 2024-07-14 DIAGNOSIS — R197 Diarrhea, unspecified: Secondary | ICD-10-CM | POA: Diagnosis not present

## 2024-07-14 DIAGNOSIS — K219 Gastro-esophageal reflux disease without esophagitis: Secondary | ICD-10-CM | POA: Diagnosis not present

## 2024-07-14 DIAGNOSIS — K621 Rectal polyp: Secondary | ICD-10-CM | POA: Diagnosis present

## 2024-11-01 ENCOUNTER — Other Ambulatory Visit: Payer: Self-pay | Admitting: Family Medicine

## 2024-11-01 DIAGNOSIS — Z1231 Encounter for screening mammogram for malignant neoplasm of breast: Secondary | ICD-10-CM

## 2025-05-30 ENCOUNTER — Encounter
